# Patient Record
Sex: Male | Born: 1975 | Hispanic: Refuse to answer | Marital: Married | Smoking: Never smoker
Health system: Southern US, Community
[De-identification: ages and names within clinical notes are randomized; demographics above are authoritative.]

## PROBLEM LIST (undated history)

## (undated) DIAGNOSIS — J45909 Unspecified asthma, uncomplicated: Secondary | ICD-10-CM

## (undated) DIAGNOSIS — N419 Inflammatory disease of prostate, unspecified: Secondary | ICD-10-CM

## (undated) DIAGNOSIS — J189 Pneumonia, unspecified organism: Secondary | ICD-10-CM

## (undated) DIAGNOSIS — F32A Depression, unspecified: Secondary | ICD-10-CM

## (undated) DIAGNOSIS — T8859XA Other complications of anesthesia, initial encounter: Secondary | ICD-10-CM

## (undated) DIAGNOSIS — I10 Essential (primary) hypertension: Secondary | ICD-10-CM

## (undated) DIAGNOSIS — R112 Nausea with vomiting, unspecified: Secondary | ICD-10-CM

## (undated) DIAGNOSIS — K219 Gastro-esophageal reflux disease without esophagitis: Secondary | ICD-10-CM

## (undated) DIAGNOSIS — K469 Unspecified abdominal hernia without obstruction or gangrene: Secondary | ICD-10-CM

## (undated) DIAGNOSIS — K429 Umbilical hernia without obstruction or gangrene: Secondary | ICD-10-CM

## (undated) DIAGNOSIS — G4730 Sleep apnea, unspecified: Secondary | ICD-10-CM

## (undated) DIAGNOSIS — F329 Major depressive disorder, single episode, unspecified: Secondary | ICD-10-CM

## (undated) HISTORY — DX: Unspecified asthma, uncomplicated: J45.909

## (undated) HISTORY — DX: Umbilical hernia without obstruction or gangrene: K42.9

## (undated) HISTORY — PX: DISTAL BICEPS TENDON REPAIR: SHX1461

## (undated) HISTORY — DX: Essential (primary) hypertension: I10

## (undated) HISTORY — DX: Inflammatory disease of prostate, unspecified: N41.9

## (undated) HISTORY — DX: Major depressive disorder, single episode, unspecified: F32.9

## (undated) HISTORY — DX: Unspecified abdominal hernia without obstruction or gangrene: K46.9

## (undated) HISTORY — DX: Depression, unspecified: F32.A

---

## 2005-12-10 ENCOUNTER — Ambulatory Visit: Payer: Self-pay | Admitting: General Surgery

## 2005-12-10 HISTORY — PX: HERNIA REPAIR: SHX51

## 2007-03-20 DIAGNOSIS — K469 Unspecified abdominal hernia without obstruction or gangrene: Secondary | ICD-10-CM

## 2007-03-20 HISTORY — DX: Unspecified abdominal hernia without obstruction or gangrene: K46.9

## 2013-01-17 DIAGNOSIS — N419 Inflammatory disease of prostate, unspecified: Secondary | ICD-10-CM

## 2013-01-17 HISTORY — DX: Inflammatory disease of prostate, unspecified: N41.9

## 2013-02-18 ENCOUNTER — Encounter: Payer: Self-pay | Admitting: General Surgery

## 2013-02-18 ENCOUNTER — Ambulatory Visit (INDEPENDENT_AMBULATORY_CARE_PROVIDER_SITE_OTHER): Payer: BC Managed Care – PPO | Admitting: General Surgery

## 2013-02-18 VITALS — BP 138/86 | HR 86 | Resp 16 | Ht 72.0 in | Wt 312.0 lb

## 2013-02-18 DIAGNOSIS — K429 Umbilical hernia without obstruction or gangrene: Secondary | ICD-10-CM

## 2013-02-18 HISTORY — DX: Umbilical hernia without obstruction or gangrene: K42.9

## 2013-02-18 NOTE — Patient Instructions (Addendum)
Patient to be scheduled for umbilical hernia repair. Patient advised of lifting restrictions and driving after being pain free.   Hernia Repair with Laparoscope A hernia occurs when an internal organ pushes out through a weak spot in the belly (abdominal) wall muscles. Hernias most commonly occur in the groin and around the navel. Hernias can also occur through a cut by the surgeon (incision) after an abdominal operation. A hernia may be caused by:  Lifting heavy objects.  Prolonged coughing.  Straining to move your bowels. Hernias can often be pushed back into place (reduced). Most hernias tend to get worse over time. Problems occur when abdominal contents get stuck in the opening and the blood supply is blocked or impaired (incarcerated hernia). Because of these risks, you require surgery to repair the hernia. Your hernia will be repaired using a laparoscope. Laparoscopic surgery is a type of minimally invasive surgery. It does not involve making a typical surgical cut (incision) in the skin. A laparoscope is a telescope-like rod and lens system. It is usually connected to a video camera and a light source so your caregiver can clearly see the operative area. The instruments are inserted through  to  inch (5 mm or 10 mm) openings in the skin at specific locations. A working and viewing space is created by blowing a small amount of carbon dioxide gas into the abdominal cavity. The abdomen is essentially blown up like a balloon (insufflated). This elevates the abdominal wall above the internal organs like a dome. The carbon dioxide gas is common to the human body and can be absorbed by tissue and removed by the respiratory system. Once the repair is completed, the small incisions will be closed with either stitches (sutures) or staples (just like a paper stapler only this staple holds the skin together). LET YOUR CAREGIVERS KNOW ABOUT:  Allergies.  Medications taken including herbs, eye drops, over  the counter medications, and creams.  Use of steroids (by mouth or creams).  Previous problems with anesthetics or Novocaine.  Possibility of pregnancy, if this applies.  History of blood clots (thrombophlebitis).  History of bleeding or blood problems.  Previous surgery.  Other health problems. BEFORE THE PROCEDURE  Laparoscopy can be done either in a hospital or out-patient clinic. You may be given a mild sedative to help you relax before the procedure. Once in the operating room, you will be given a general anesthesia to make you sleep (unless you and your caregiver choose a different anesthetic).  AFTER THE PROCEDURE  After the procedure you will be watched in a recovery area. Depending on what type of hernia was repaired, you might be admitted to the hospital or you might go home the same day. With this procedure you may have less pain and scarring. This usually results in a quicker recovery and less risk of infection. HOME CARE INSTRUCTIONS   Bed rest is not required. You may continue your normal activities but avoid heavy lifting (more than 10 pounds) or straining.  Cough gently. If you are a smoker it is best to stop, as even the best hernia repair can break down with the continual strain of coughing.  Avoid driving until given the OK by your surgeon.  There are no dietary restrictions unless given otherwise.  TAKE ALL MEDICATIONS AS DIRECTED.  Only take over-the-counter or prescription medicines for pain, discomfort, or fever as directed by your caregiver. SEEK MEDICAL CARE IF:   There is increasing abdominal pain or pain in your  incisions.  There is more bleeding from incisions, other than minimal spotting.  You feel light headed or faint.  You develop an unexplained fever, chills, and/or an oral temperature above 102 F (38.9 C).  You have redness, swelling, or increasing pain in the wound.  Pus coming from wound.  A foul smell coming from the wound or  dressings. SEEK IMMEDIATE MEDICAL CARE IF:   You develop a rash.  You have difficulty breathing.  You have any allergic problems. MAKE SURE YOU:   Understand these instructions.  Will watch your condition.  Will get help right away if you are not doing well or get worse. Document Released: 03/05/2005 Document Revised: 05/28/2011 Document Reviewed: 02/02/2009 [facility] Patient Information 2014 [place], [place].  Patient's surgery has been scheduled for 03-06-13 at [facility].

## 2013-02-18 NOTE — Progress Notes (Addendum)
Patient ID: Shawn Bates, male   DOB: February 11, 1976, 37 y.o.   MRN: [id number]  Chief Complaint  Patient presents with  . Other    New patient evaluation of an umbilical hernia    HPI Shawn Bates is a 37 y.o. male who presents for an evaluation of an umbilical hernia. The patient was referred by Dr. Thana Ates. The patient states he noticed the hernia approximately 6 months ago. It has gotten larger in size. He also states that he is starting to have tenderness and discomfort when he leans or bumps up against that area. 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 He denies any nausea, vomiting, diarrhea, or constipation.   HPI  Past Medical History  Diagnosis Date  . Hernia 2009  . Asthma   . Depression   . Hypertension     Past Surgical History  Procedure Laterality Date  . Hernia repair  December 10, 2005    ventral hernia with Kugel patch.    Family History  Problem Relation Age of Onset  . Cancer Mother     Social History History  Substance Use Topics  . Smoking status: Never Smoker   . Smokeless tobacco: Not on file  . Alcohol Use: No    No Known Allergies  Current Outpatient Prescriptions  Medication Sig Dispense Refill  . ciprofloxacin (CIPRO) 500 MG tablet Take 1 tablet by mouth 2 (two) times daily.      . citalopram (CELEXA) 10 MG tablet Take 1-2 tablets by mouth daily.       No current facility-administered medications for this visit.    Review of Systems Review of Systems  Constitutional: Negative.   Respiratory: Negative.   Cardiovascular: Negative.   Gastrointestinal: Positive for abdominal pain.  Genitourinary: Positive for frequency (improved with oral Cipro therapy).    Blood pressure 138/86, pulse 86, resp. rate 16, height 6' (1.829 m), weight 312 lb (141.522 kg).  Physical Exam Physical Exam  Constitutional: He is oriented to person, place, and time. He appears well-developed and well-nourished.  Neck: No thyromegaly present.  Cardiovascular: Normal  rate, regular rhythm and normal heart sounds.   No murmur heard. Pulmonary/Chest: Effort normal and breath sounds normal.  Abdominal: Soft. Normal appearance and bowel sounds are normal. There is tenderness. A hernia is present.  1 cm fascial defect at the umbilicus. Epigastric incision site well healed.   Lymphadenopathy:    He has no cervical adenopathy.  Neurological: He is alert and oriented to person, place, and time.  Skin: Skin is warm and dry.    Data Reviewed PCP Notes of February 10, 2013.  Assessment    Umbilical hernia.  Recent prostatitis, improved on therapy.    Plan    Arrangements are in place to have the patient undergo umbilical hernia repair in the near future. The possible role of prosthetic mesh was again reviewed. 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 This can be adjusted at his workplace as it was in 2007.     Patient's surgery has been scheduled for 03-06-13 at [facility].   Earline Mayotte 02/20/2013, 8:36 PM

## 2013-02-20 ENCOUNTER — Encounter: Payer: Self-pay | Admitting: General Surgery

## 2013-02-21 ENCOUNTER — Encounter: Payer: Self-pay | Admitting: General Surgery

## 2013-02-21 ENCOUNTER — Other Ambulatory Visit: Payer: Self-pay | Admitting: General Surgery

## 2013-02-21 DIAGNOSIS — K429 Umbilical hernia without obstruction or gangrene: Secondary | ICD-10-CM

## 2013-03-02 ENCOUNTER — Ambulatory Visit: Payer: Self-pay | Admitting: Otolaryngology

## 2013-03-02 LAB — URINALYSIS, COMPLETE
Bacteria: NONE SEEN
Bilirubin,UR: NEGATIVE
Glucose,UR: NEGATIVE mg/dL (ref 0–75)
Ketone: NEGATIVE
Leukocyte Esterase: NEGATIVE
Nitrite: NEGATIVE
Ph: 6 (ref 4.5–8.0)
Protein: NEGATIVE

## 2013-03-02 LAB — URINALYSIS
Bilirubin, UA: NEGATIVE
Glucose, UA: NEGATIVE
Protein, UA: NEGATIVE
Specific Gravity, UA: 1.009 (ref 1.005–1.030)
pH, UA: 6 (ref 4.5–8.0)

## 2013-03-04 ENCOUNTER — Encounter: Payer: Self-pay | Admitting: General Surgery

## 2013-03-06 ENCOUNTER — Ambulatory Visit: Payer: Self-pay | Admitting: General Surgery

## 2013-03-06 DIAGNOSIS — K429 Umbilical hernia without obstruction or gangrene: Secondary | ICD-10-CM

## 2013-03-06 HISTORY — PX: HERNIA REPAIR: SHX51

## 2013-03-09 ENCOUNTER — Encounter: Payer: Self-pay | Admitting: General Surgery

## 2013-03-18 ENCOUNTER — Encounter: Payer: Self-pay | Admitting: General Surgery

## 2013-03-18 ENCOUNTER — Ambulatory Visit (INDEPENDENT_AMBULATORY_CARE_PROVIDER_SITE_OTHER): Payer: BC Managed Care – PPO | Admitting: General Surgery

## 2013-03-18 VITALS — BP 130/74 | HR 76 | Resp 14 | Ht 72.0 in | Wt 309.0 lb

## 2013-03-18 DIAGNOSIS — K429 Umbilical hernia without obstruction or gangrene: Secondary | ICD-10-CM

## 2013-03-18 NOTE — Patient Instructions (Addendum)
Patient to return as needed. Proper lifting techniques reviewed. 

## 2013-03-18 NOTE — Progress Notes (Signed)
Patient ID: Shawn Bates, male   DOB: [birth year], 37 y.o.   MRN: [id number]  Chief Complaint  Patient presents with  . Routine Post Op    umbilical hernia repair    HPI Shawn Bates is a 37 y.o. male here today for post op umbilical hernia repair done on 03/06/13. Patient states he is doing well. Minimal narcotic requirements. HPI  Past Medical History  Diagnosis Date  . Hernia 2009  . Asthma   . Depression   . Hypertension   . Prostatitis November 2014    Treated with oral Cipro with relief of symptoms    Past Surgical History  Procedure Laterality Date  . Hernia repair  December 10, 2005    ventral hernia with Kugel patch.  . Hernia repair  03/06/13    umbilical hernia    Family History  Problem Relation Age of Onset  . Cancer Mother     Social History History  Substance Use Topics  . Smoking status: Never Smoker   . Smokeless tobacco: Never Used  . Alcohol Use: No    No Known Allergies  Current Outpatient Prescriptions  Medication Sig Dispense Refill  . citalopram (CELEXA) 10 MG tablet Take 1-2 tablets by mouth daily.      Marland Kitchen HYDROcodone-acetaminophen (NORCO/VICODIN) 5-325 MG per tablet Take 1 tablet by mouth as needed.      . ciprofloxacin (CIPRO) 500 MG tablet Take 1 tablet by mouth 2 (two) times daily.       No current facility-administered medications for this visit.    Review of Systems Review of Systems  Constitutional: Negative.   Respiratory: Negative.   Cardiovascular: Negative.     Blood pressure 130/74, pulse 76, resp. rate 14, height 6' (1.829 m), weight 309 lb (140.161 kg).  Physical Exam Physical Exam  Constitutional: He is oriented to person, place, and time. He appears well-developed and well-nourished.  Eyes: No scleral icterus.  Lymphadenopathy:    He has no cervical adenopathy.  Neurological: He is alert and oriented to person, place, and time.  Skin: Skin is warm and dry.      Assessment    Doing well status post primary  repair of umbilical hernia.     Plan    Good judgment was strenuous activity was discussed. He'll call if he has any concerns or questions. Follow up otherwise will be on an as needed basis.        Earline Mayotte 03/20/2013, 1:47 PM

## 2013-03-24 ENCOUNTER — Ambulatory Visit: Payer: BC Managed Care – PPO | Admitting: General Surgery

## 2013-04-06 ENCOUNTER — Encounter: Payer: Self-pay | Admitting: General Surgery

## 2014-07-09 NOTE — Op Note (Signed)
PATIENT NAME:  Shawn Bates, Shawn Bates MR#:  [id number] DATE OF BIRTH:  1975/08/10  DATE OF PROCEDURE:  03/06/2013  PREOPERATIVE DIAGNOSIS:  Umbilical hernia.   POSTOPERATIVE DIAGNOSIS:  Umbilical hernia.  OPERATIVE PROCEDURE:  Repair of umbilical hernia.   SURGEON:  Donnalee CurryJeffrey Anjolina Byrer, M.D.   ANESTHESIA:  General by LMA, Marcaine 0.5% plain 30 mL local infiltration, Toradol 30 mg.   CLINICAL NOTE:  This 39 year old male has developed a symptomatic umbilical hernia.  He was admitted for elective repair.  He received Kefzol intravenously.   OPERATIVE NOTE:  With the patient under adequate general anesthesia, the abdomen was prepped with ChloraPrep and draped.  Hair had previously been removed with clippers.  An infraumbilical incision was made and carried down through the skin and subcutaneous tissue with hemostasis achieved by electrocautery.  The hernia sac was excised at the fascial level and discarded.  The preperitoneal fat was returned to its normal location and the undersurface of the fascia cleared.  Interrupted 0 Surgilon sutures were placed under direct vision.  The defect was approximately 8 to 10 mm in diameter.  The umbilical skin was tacked to the fascia with 3-0 Vicryl.  The adipose tissue was approximated with running 3-0 Vicryl.  The skin was closed with a running 4-0 Vicryl subcuticular suture.  Benzoin and Steri-Strips were applied followed by a Telfa and Tegaderm dressing.  Marcaine had been infiltrated into the wound prior to the procedure and Toradol was placed into the wound for postoperative analgesia.   The patient tolerated the procedure well.    ____________________________ Earline MayotteJeffrey W. Annayah Worthley, MD jwb:ea D: 03/06/2013 21:56:08 ET T: 03/07/2013 06:08:25 ET JOB#: [id number]  cc: Earline MayotteJeffrey W. Leylanie Woodmansee, MD, <Dictator> Earline MayotteJeffrey W. Charity Tessier, MD Dennison MascotLemont Morrisey, MD Jakiyah Stepney Brion AlimentW Tashayla Therien MD ELECTRONICALLY SIGNED 03/07/2013 10:36

## 2014-11-10 ENCOUNTER — Telehealth: Payer: Self-pay | Admitting: Urology

## 2014-11-10 ENCOUNTER — Other Ambulatory Visit: Payer: Self-pay | Admitting: Urology

## 2014-11-10 MED ORDER — DOXYCYCLINE HYCLATE 100 MG PO CAPS: 100.0000 mg | ORAL_CAPSULE | Freq: Every day | ORAL | Status: DC

## 2014-11-10 NOTE — Telephone Encounter (Signed)
[facility] 

## 2014-11-10 NOTE — Telephone Encounter (Signed)
Patient called today and stated that he is having a flare up with chronic prostatitits and has not been seen since 04-2014 by dr. Edwyna Shell. i have made  Him an appt to see him for 11-30-14. He would like a script for doxycyline  called [facility] to [facility] [address] to get him thru until his appt. i asked him to give [place] until the end of business today to get that called in for him.  Thanks, Marcelino Duster

## 2014-11-30 ENCOUNTER — Ambulatory Visit: Payer: Self-pay | Admitting: Urology

## 2014-12-22 ENCOUNTER — Encounter: Payer: Self-pay | Admitting: Urology

## 2014-12-22 ENCOUNTER — Other Ambulatory Visit: Payer: Self-pay | Admitting: Urology

## 2014-12-22 ENCOUNTER — Ambulatory Visit (INDEPENDENT_AMBULATORY_CARE_PROVIDER_SITE_OTHER): Payer: BLUE CROSS/BLUE SHIELD | Admitting: Urology

## 2014-12-22 VITALS — BP 134/91 | HR 101 | Ht 72.0 in | Wt 340.3 lb

## 2014-12-22 DIAGNOSIS — N3281 Overactive bladder: Secondary | ICD-10-CM | POA: Diagnosis not present

## 2014-12-22 DIAGNOSIS — N410 Acute prostatitis: Secondary | ICD-10-CM

## 2014-12-22 LAB — URINALYSIS, COMPLETE
Bilirubin, UA: NEGATIVE
Glucose, UA: NEGATIVE
KETONES UA: NEGATIVE
Leukocytes, UA: NEGATIVE
NITRITE UA: NEGATIVE
Protein, UA: NEGATIVE
Specific Gravity, UA: >1.03 — ABNORMAL HIGH (ref 1.005–1.030)
Urobilinogen, Ur: 0.2 mg/dL (ref 0.2–1.0)
pH, UA: 5.5 (ref 5.0–7.5)

## 2014-12-22 LAB — MICROSCOPIC EXAMINATION
RBC MICROSCOPIC, UA: NONE SEEN /HPF (ref 0–?)
Renal Epithel, UA: NONE SEEN /hpf

## 2014-12-22 MED ORDER — SOLIFENACIN SUCCINATE 10 MG PO TABS
10.0000 mg | ORAL_TABLET | Freq: Every day | ORAL | Status: DC
Start: 2014-12-22 — End: 2016-04-10

## 2014-12-22 NOTE — Telephone Encounter (Signed)
I spoke with Mr Blowe; he stated that he has picked up his Rx at his pharmacy. . . sm

## 2014-12-22 NOTE — Progress Notes (Signed)
12/22/2014 9:12 AM   Shawn Bates [birth year] [id number]  Referring provider: Dennison Mascot, MD [address] [place], [place] [address]  Chief Complaint  Patient presents with  . Prostatitis    HPI: Patient has 2 complaints. First is of frequency of urination every 15 minutes although it has improved somewhat with treatment for his chronic prostatitis in the form of long-term low-dose doxycycline. He does not drink caffeine laden beverages or smoking. He relates no urinary tract infections is not diabetic at this point although he is somewhat overweight.   PMH: Past Medical History  Diagnosis Date  . Hernia 2009  . Asthma   . Depression   . Hypertension   . Prostatitis November 2014    Treated with oral Cipro with relief of symptoms  . Umbilical hernia 02/18/2013    Surgical History: Past Surgical History  Procedure Laterality Date  . Hernia repair  December 10, 2005    ventral hernia with Kugel patch.  . Hernia repair  03/06/13    umbilical hernia    Home Medications:    Medication List       This list is accurate as of: 12/22/14  9:12 AM.  Always use your most recent med list.               doxycycline 100 MG capsule  Commonly known as:  VIBRAMYCIN  Take 1 capsule (100 mg total) by mouth daily.     naproxen sodium 220 MG tablet  Commonly known as:  ANAPROX  Take 220 mg by mouth 3 (three) times daily with meals.        Allergies: No Known Allergies  Family History: Family History  Problem Relation Age of Onset  . Bone cancer Mother   . Prostate cancer Neg Hx   . Bladder Cancer Neg Hx   . Kidney disease Neg Hx     Social History:  reports that he has never smoked. He has never used smokeless tobacco. He reports that he does not drink alcohol or use illicit drugs.  ROS: UROLOGY Frequent Urination?: Yes Hard to postpone urination?: No Burning/pain with urination?: No Get up at night to urinate?: Yes Leakage of urine?:  No Urine stream starts and stops?: No Trouble starting stream?: No Do you have to strain to urinate?: No Blood in urine?: No Urinary tract infection?: No Sexually transmitted disease?: No Injury to kidneys or bladder?: No Painful intercourse?: No Weak stream?: No Erection problems?: No Penile pain?: No  Gastrointestinal Nausea?: No Vomiting?: No Indigestion/heartburn?: No Diarrhea?: No Constipation?: No  Constitutional Fever: No Night sweats?: No Weight loss?: No Fatigue?: No  Skin Skin rash/lesions?: No Itching?: No  Eyes Blurred vision?: No Double vision?: No  Ears/Nose/Throat Sore throat?: No Sinus problems?: No  Hematologic/Lymphatic Swollen glands?: No Easy bruising?: No  Cardiovascular Leg swelling?: No Chest pain?: No  Respiratory Cough?: No Shortness of breath?: No  Endocrine Excessive thirst?: No  Musculoskeletal Back pain?: No Joint pain?: No  Neurological Headaches?: No Dizziness?: No  Psychologic Depression?: No Anxiety?: No  Physical Exam: BP 134/91 mmHg  Pulse 101  Ht 6' (1.829 m)  Wt 340 lb 4.8 oz (154.359 kg)  BMI 46.14 kg/m2  Constitutional:  Alert and oriented, No acute distress. HEENT: [place] AT, moist mucus membranes.  Trachea midline, no masses. Cardiovascular: No clubbing, cyanosis, or edema. Respiratory: Normal respiratory effort, no increased work of breathing. GI: Abdomen is soft, nontender, nondistended, no abdominal masses GU: No CVA tenderness. Penis  and testes normal Skin: No rashes, bruises or suspicious lesions. Lymph: No cervical or inguinal adenopathy. Neurologic: Grossly intact, no focal deficits, moving all 4 extremities. Psychiatric: Normal mood and affect.  Laboratory Data: No results found for: WBC, HGB, HCT, MCV, PLT  No results found for: CREATININE  No results found for: PSA  No results found for: TESTOSTERONE  No results found for: HGBA1C  Urinalysis    Component Value Date/Time    COLORURINE Straw 03/02/2013 1530   COLORURINE straw 03/02/2013   APPEARANCEUR Clear 03/02/2013 1530   LABSPEC 1.009 03/02/2013 1530   PHURINE 6.0 03/02/2013 1530   GLUCOSEU Negative 03/02/2013 1530   GLUCOSEU Negative 03/02/2013   HGBUR 1+ 03/02/2013 1530   BILIRUBINUR Negative 03/02/2013 1530   BILIRUBINUR Negative 03/02/2013   KETONESUR Negative 03/02/2013 1530   PROTEINUR Negative 03/02/2013 1530   NITRITE Negative 03/02/2013 1530   NITRITE Negative 03/02/2013   LEUKOCYTESUR Negative 03/02/2013 1530   LEUKOCYTESUR Negative 03/02/2013    Pertinent Imaging: None  Assessment & Plan:  Patient with prostatitis and probable overactive bladder. Plan is to continue him on his doxycycline as needed but to add Vesicare 10 mg daily for 3 months. He had been on an anticholinergic in the past and it helped him with his every 15 minutes urge to void. Patient controls his caffeine intake very well. He does not smoke. He relates some occasional problems with holding his erection but generally has good erectile ability but not like he was when he was 28 years I counseled him on the fact that as he gets older there may be times when he doesn't hold his erection to completion of the sexual activity I will follow-up with him in his office in 3  There are no diagnoses linked to this encounter.  No Follow-up on file.  Lorraine Lax, MD  [facility] [address], [address] [place], [place] [address] [phone]

## 2014-12-22 NOTE — Telephone Encounter (Signed)
Mr. Venus was seen in the office this morning by Dr. Edwyna Shell.  He was supposed to call something in to his pharmacy for overactive bladder.  The pharmacy he uses is the [facility].  Please call the Rx to there.

## 2015-03-28 ENCOUNTER — Ambulatory Visit: Payer: BLUE CROSS/BLUE SHIELD

## 2015-04-25 ENCOUNTER — Ambulatory Visit: Payer: BLUE CROSS/BLUE SHIELD

## 2016-01-16 ENCOUNTER — Encounter: Payer: Self-pay | Admitting: Urology

## 2016-01-16 ENCOUNTER — Ambulatory Visit: Payer: BLUE CROSS/BLUE SHIELD | Admitting: Urology

## 2016-01-16 VITALS — BP 158/107 | HR 102 | Ht 72.0 in | Wt 330.3 lb

## 2016-01-16 DIAGNOSIS — N3281 Overactive bladder: Secondary | ICD-10-CM

## 2016-01-16 DIAGNOSIS — R351 Nocturia: Secondary | ICD-10-CM | POA: Diagnosis not present

## 2016-01-16 LAB — URINALYSIS, COMPLETE
Bilirubin, UA: NEGATIVE
Glucose, UA: NEGATIVE
KETONES UA: NEGATIVE
LEUKOCYTES UA: NEGATIVE
NITRITE UA: NEGATIVE
Protein, UA: NEGATIVE
SPEC GRAV UA: 1.015 (ref 1.005–1.030)
Urobilinogen, Ur: 0.2 mg/dL (ref 0.2–1.0)
pH, UA: 6 (ref 5.0–7.5)

## 2016-01-16 LAB — MICROSCOPIC EXAMINATION
BACTERIA UA: NONE SEEN
EPITHELIAL CELLS (NON RENAL): NONE SEEN /HPF (ref 0–10)

## 2016-01-16 LAB — BLADDER SCAN AMB NON-IMAGING: Scan Result: 80

## 2016-01-16 NOTE — Progress Notes (Signed)
40 year old male who presents today for symptoms of overactive bladder. The patient states that his symptoms began approximately 2 weeks ago. He states that he has been getting up one to 2 times per night. He also states that he has a feeling of incomplete bladder emptying and urinary frequency. The patient has a history of prostatitis, he was treated for this in August 2016. Follow-up in October 20 16 Disher the patient symptoms it clears and he was back to his baseline. The patient does not take any medications for his voiding symptoms.  The patient states that he substrate can 3 hours prior to bedtime. He does have obstructive sleep apnea for which he is scheduled for a sleep study in the past and declined it. He does drink a lot of soda, coffee. He denies constipation.   Current Outpatient Prescriptions on File Prior to Visit  Medication Sig Dispense Refill  . doxycycline (VIBRAMYCIN) 100 MG capsule Take 1 capsule (100 mg total) by mouth daily. (Patient not taking: Reported on 01/16/2016) 30 capsule 0  . naproxen sodium (ANAPROX) 220 MG tablet Take 220 mg by mouth 3 (three) times daily with meals.    . solifenacin (VESICARE) 10 MG tablet Take 1 tablet (10 mg total) by mouth daily. (Patient not taking: Reported on 01/16/2016) 90 tablet 2   No current facility-administered medications on file prior to visit.    Past Medical History:  Diagnosis Date  . Asthma   . Depression   . Hernia 2009  . Hypertension   . Prostatitis November 2014   Treated with oral Cipro with relief of symptoms  . Umbilical hernia 02/18/2013   Past Surgical History:  Procedure Laterality Date  . HERNIA REPAIR  December 10, 2005   ventral hernia with Kugel patch.  Marland Kitchen. HERNIA REPAIR  03/06/13   umbilical hernia   NAD Vitals:   01/16/16 1023  BP: (!) 158/107  Pulse: (!) 102   Obese ABdomen soft No peripheral edema  Urine dipstick shows negative for all components.  PVR: 80 mL's  Impression: The patient  has symptoms of overactive bladder. The etiology of his acute progressive symptoms is unclear to me. However, his UA is normal and his symptoms are otherwise reassuring.  Recommendations: I went over numerous suggestions for the patient to help minimize his voiding symptoms including increasing his fluid intake, namely water. I encouraged him to decrease the amount of coffee and soda and his diet. I also went over the dietary guidelines for interstitial cystitis essentially eliminating spicy foods. Further, I explained to him the name role of obstructive sleep apnea in nocturia and encouraged the patient to consider a sleep study. Finally, I reassured the patient in regards to his symptoms. I suggested that the patient try myrbetriq 50 mg daily and I have given him a month's supply. I suspect he will need this for few days/weeks but if he needs a refill or a new prescription he is to call the office. Otherwise, the patient will follow-up with us as needed.

## 2016-01-26 ENCOUNTER — Ambulatory Visit: Payer: BLUE CROSS/BLUE SHIELD

## 2016-04-10 ENCOUNTER — Ambulatory Visit (INDEPENDENT_AMBULATORY_CARE_PROVIDER_SITE_OTHER): Payer: BLUE CROSS/BLUE SHIELD | Admitting: Family Medicine

## 2016-04-10 ENCOUNTER — Encounter: Payer: Self-pay | Admitting: Family Medicine

## 2016-04-10 DIAGNOSIS — M62830 Muscle spasm of back: Secondary | ICD-10-CM | POA: Diagnosis not present

## 2016-04-10 DIAGNOSIS — Z8709 Personal history of other diseases of the respiratory system: Secondary | ICD-10-CM | POA: Diagnosis not present

## 2016-04-10 MED ORDER — CYCLOBENZAPRINE HCL 5 MG PO TABS: 5.0000 mg | ORAL_TABLET | Freq: Three times a day (TID) | ORAL | 0 refills | Status: DC | PRN

## 2016-04-10 NOTE — Progress Notes (Signed)
Name: Shawn Bates   MRN: [id number]    DOB: 1975/05/31   Date:04/10/2016       Progress Note  Subjective  Chief Complaint  Chief Complaint  Patient presents with  . Follow-up    possible strep throat. On call MD called in script 2 weeks ago for Amox 875 mg  . Back Pain    LBP    Back Pain  This is a new problem. The current episode started in the past 7 days. The problem occurs daily. The pain is present in the lumbar spine. The quality of the pain is described as aching (described as a tightness in the lower back). The pain is at a severity of 6/10. The symptoms are aggravated by standing (prolonged standing makes it worse). Pertinent negatives include no bladder incontinence, bowel incontinence, leg pain, numbness, perianal numbness or tingling. He has tried analgesics and NSAIDs for the symptoms. The treatment provided no relief.    Past Medical History:  Diagnosis Date  . Asthma   . Depression   . Hernia 2009  . Hypertension   . Prostatitis November 2014   Treated with oral Cipro with relief of symptoms  . Umbilical hernia 02/18/2013    Past Surgical History:  Procedure Laterality Date  . HERNIA REPAIR  December 10, 2005   ventral hernia with Kugel patch.  Marland Kitchen HERNIA REPAIR  03/06/13   umbilical hernia    Family History  Problem Relation Age of Onset  . Bone cancer Mother   . Prostate cancer Neg Hx   . Bladder Cancer Neg Hx   . Kidney disease Neg Hx     Social History   Social History  . Marital status: Married    Spouse name: N/A  . Number of children: N/A  . Years of education: N/A   Occupational History  . Not on file.   Social History Main Topics  . Smoking status: Never Smoker  . Smokeless tobacco: Never Used  . Alcohol use No  . Drug use: No  . Sexual activity: Not on file   Other Topics Concern  . Not on file   Social History Narrative  . No narrative on file    No current outpatient prescriptions on file.  No Known  Allergies   Review of Systems  Gastrointestinal: Negative for bowel incontinence.  Genitourinary: Negative for bladder incontinence.  Musculoskeletal: Positive for back pain.  Neurological: Negative for tingling and numbness.    Objective  Vitals:   04/10/16 1446  BP: 130/80  Pulse: (!) 110  Resp: 18  Temp: 98.3 F (36.8 C)  TempSrc: Oral  SpO2: 96%  Weight: (!) 333 lb 14.4 oz (151.5 kg)  Height: 6' (1.829 m)    Physical Exam  Constitutional: He is oriented to person, place, and time and well-developed, well-nourished, and in no distress.  HENT:  Head: Normocephalic and atraumatic.  Mouth/Throat: Oropharyngeal exudate and posterior oropharyngeal erythema present.  Cardiovascular: Normal rate, regular rhythm and normal heart sounds.   No murmur heard. Pulmonary/Chest: Effort normal and breath sounds normal. He has no wheezes.  Musculoskeletal:       Lumbar back: He exhibits spasm. He exhibits no swelling and no pain.       Back:  Neurological: He is alert and oriented to person, place, and time.  Nursing note and vitals reviewed.        Assessment & Plan  1. History of streptococcal pharyngitis Patient has finished a course of amoxicillin, symptoms  have resolved, no indication for further testing  2. Back muscle spasm I can associated with prolonged standing versus abrupt movement involving the mid to lower back. Recommended heating pad and will start on cyclobenzaprine as needed. - cyclobenzaprine (FLEXERIL) 5 MG tablet; Take 1 tablet (5 mg total) by mouth 3 (three) times daily as needed for muscle spasms.  Dispense: 30 tablet; Refill: 0   [name] Asad A. Faylene KurtzShah [facility] 04/10/2016 2:52 PM

## 2017-06-13 ENCOUNTER — Encounter: Payer: Self-pay | Admitting: Emergency Medicine

## 2017-06-13 ENCOUNTER — Emergency Department
Admission: EM | Admit: 2017-06-13 | Discharge: 2017-06-13 | Disposition: A | Discharge status: Home / Self Care | Payer: BLUE CROSS/BLUE SHIELD | Attending: Emergency Medicine | Admitting: Emergency Medicine

## 2017-06-13 ENCOUNTER — Other Ambulatory Visit: Payer: Self-pay

## 2017-06-13 ENCOUNTER — Emergency Department: Payer: BLUE CROSS/BLUE SHIELD

## 2017-06-13 DIAGNOSIS — M25551 Pain in right hip: Secondary | ICD-10-CM | POA: Insufficient documentation

## 2017-06-13 DIAGNOSIS — I10 Essential (primary) hypertension: Secondary | ICD-10-CM | POA: Insufficient documentation

## 2017-06-13 DIAGNOSIS — R55 Syncope and collapse: Secondary | ICD-10-CM | POA: Insufficient documentation

## 2017-06-13 DIAGNOSIS — R112 Nausea with vomiting, unspecified: Secondary | ICD-10-CM | POA: Diagnosis not present

## 2017-06-13 DIAGNOSIS — J45909 Unspecified asthma, uncomplicated: Secondary | ICD-10-CM | POA: Diagnosis not present

## 2017-06-13 DIAGNOSIS — M25851 Other specified joint disorders, right hip: Secondary | ICD-10-CM | POA: Diagnosis not present

## 2017-06-13 DIAGNOSIS — R42 Dizziness and giddiness: Secondary | ICD-10-CM | POA: Diagnosis not present

## 2017-06-13 LAB — BASIC METABOLIC PANEL
Anion gap: 13 (ref 5–15)
BUN: 12 mg/dL (ref 6–20)
CHLORIDE: 105 mmol/L (ref 101–111)
CO2: 19 mmol/L — AB (ref 22–32)
CREATININE: 0.91 mg/dL (ref 0.61–1.24)
Calcium: 9.4 mg/dL (ref 8.9–10.3)
GFR calc non Af Amer: >60 mL/min (ref >60)
GLUCOSE: 157 mg/dL — AB (ref 65–99)
Potassium: 3.4 mmol/L — ABNORMAL LOW (ref 3.5–5.1)
Sodium: 137 mmol/L (ref 135–145)

## 2017-06-13 LAB — CBC
HEMATOCRIT: 41.9 % (ref 40.0–52.0)
HEMOGLOBIN: 13.8 g/dL (ref 13.0–18.0)
MCH: 29.5 pg (ref 26.0–34.0)
MCHC: 33 g/dL (ref 32.0–36.0)
MCV: 89.6 fL (ref 80.0–100.0)
Platelets: 337 10*3/uL (ref 150–440)
RBC: 4.68 MIL/uL (ref 4.40–5.90)
RDW: 13.3 % (ref 11.5–14.5)
WBC: 12.3 10*3/uL — ABNORMAL HIGH (ref 3.8–10.6)

## 2017-06-13 LAB — TROPONIN I: Troponin I: <0.03 ng/mL (ref <0.03)

## 2017-06-13 MED ORDER — PROMETHAZINE HCL 25 MG/ML IJ SOLN
12.5000 mg | Freq: Once | INTRAMUSCULAR | Status: AC
  Administered 2017-06-13: 12.5 mg via INTRAVENOUS
  Filled 2017-06-13: qty 1

## 2017-06-13 MED ORDER — SODIUM CHLORIDE 0.9 % IV BOLUS
1000.0000 mL | Freq: Once | INTRAVENOUS | Status: AC
Start: 2017-06-13 — End: 2017-06-13
  Administered 2017-06-13: 1000 mL via INTRAVENOUS

## 2017-06-13 MED ORDER — OXYCODONE-ACETAMINOPHEN 5-325 MG PO TABS: 1.0000 | ORAL_TABLET | Freq: Four times a day (QID) | ORAL | 0 refills | Status: DC | PRN

## 2017-06-13 MED ORDER — ONDANSETRON HCL 4 MG/2ML IJ SOLN
4.0000 mg | Freq: Once | INTRAMUSCULAR | Status: AC
  Administered 2017-06-13: 4 mg via INTRAVENOUS

## 2017-06-13 MED ORDER — HYDROMORPHONE HCL 1 MG/ML IJ SOLN
INTRAMUSCULAR | Status: AC
  Filled 2017-06-13: qty 1

## 2017-06-13 MED ORDER — SODIUM CHLORIDE 0.9 % IV BOLUS
1000.0000 mL | Freq: Once | INTRAVENOUS | Status: AC
  Administered 2017-06-13: 1000 mL via INTRAVENOUS

## 2017-06-13 MED ORDER — HYDROMORPHONE HCL 1 MG/ML IJ SOLN
1.0000 mg | Freq: Once | INTRAMUSCULAR | Status: AC
  Administered 2017-06-13: 1 mg via INTRAVENOUS

## 2017-06-13 MED ORDER — ONDANSETRON HCL 4 MG/2ML IJ SOLN
INTRAMUSCULAR | Status: AC
  Filled 2017-06-13: qty 2

## 2017-06-13 NOTE — ED Triage Notes (Signed)
Pt taken out of car.  Pt was at ortho office for right hip pain.  After xray was leaving office and collapsed with near syncope into car and wifes arms. Pt diaphoretic through clothes. Vomiting. C/o dizziness.  Weak getting out of car.  Dizziness has improved from prior per pt.

## 2017-06-13 NOTE — ED Notes (Signed)
Patient transported to CT 

## 2017-06-13 NOTE — ED Notes (Signed)
Pt given crutches

## 2017-06-13 NOTE — Discharge Instructions (Addendum)
Please follow-up with orthopedics as scheduled for further evaluation into your right hip pain.  Please take your pain medication as needed but only as prescribed.  You may also use ibuprofen for discomfort.  Limit total Tylenol/acetaminophen intake to 4 g/day (Percocet contains 325 mg of Tylenol per tablet).  Return to the emergency department for any further lightheadedness, dizziness, if you develop any chest pain or trouble breathing, or any other symptom personally concerning to yourself.

## 2017-06-13 NOTE — ED Provider Notes (Addendum)
[facility] Emergency Department Provider Note  Time seen: 10:10 AM  I have reviewed the triage vital signs and the nursing notes.   HISTORY  Chief Complaint Hip Pain and Near Syncope    HPI Shawn Bates is a 42 y.o. male with a past medical history of asthma, depression, hypertension, presents to the emergency department after near syncopal event.  According to the patient since Sunday he has been experiencing progressively worsening right hip pain.  Denies any inciting event.  States approxi-8 years ago he had similar discomfort in the right hip, but never found out what caused it.  Denies any trauma or falls.  Patient was seeing orthopedics today had x-rays performed, which he states significantly exacerbated his pain.  While walking out to his car he was experiencing severe right hip pain became dizzy/lightheaded and diaphoretic.  Denies any chest pain or trouble breathing abdominal pain.  Did become nauseated and vomit when he was feeling lightheaded.  Largely negative review of systems otherwise.  Describes as right hip pain currently is moderate to severe aching pain, sharp pain with attempted movement or ambulation.   Past Medical History:  Diagnosis Date  . Asthma   . Depression   . Hernia 2009  . Hypertension   . Prostatitis November 2014   Treated with oral Cipro with relief of symptoms  . Umbilical hernia 02/18/2013    Patient Active Problem List   Diagnosis Date Noted  . History of streptococcal pharyngitis 04/10/2016  . Back muscle spasm 04/10/2016  . Umbilical hernia 02/18/2013    Past Surgical History:  Procedure Laterality Date  . HERNIA REPAIR  December 10, 2005   ventral hernia with Kugel patch.  Marland Kitchen. HERNIA REPAIR  03/06/13   umbilical hernia    Prior to Admission medications   Medication Sig Start Date End Date Taking? Authorizing Provider  cyclobenzaprine (FLEXERIL) 5 MG tablet Take 1 tablet (5 mg total) by mouth 3 (three) times  daily as needed for muscle spasms. 04/10/16   Ellyn HackShah, Syed Asad A, MD    No Known Allergies  Family History  Problem Relation Age of Onset  . Bone cancer Mother   . Prostate cancer Neg Hx   . Bladder Cancer Neg Hx   . Kidney disease Neg Hx     Social History Social History   Tobacco Use  . Smoking status: Never Smoker  . Smokeless tobacco: Never Used  Substance Use Topics  . Alcohol use: No  . Drug use: No    Review of Systems Constitutional: Negative for fever.  Positive for lightheadedness, now resolved Eyes: Negative for visual complaints ENT: Negative for recent illness/congestion Cardiovascular: Negative for chest pain. Respiratory: Negative for shortness of breath. Gastrointestinal: Negative for abdominal pain.  Positive for nausea vomiting, now resolved Genitourinary: Negative for urinary compaints Musculoskeletal: Significant right hip pain.  No lower extremity swelling.   Skin: Negative for skin complaints  Neurological: Negative for headache All other ROS negative  ____________________________________________   PHYSICAL EXAM:  VITAL SIGNS: ED Triage Vitals  Enc Vitals Group     BP 06/13/17 1000 133/89     Pulse --      Resp 06/13/17 1000 (!) 22     Temp --      Temp src --      SpO2 06/13/17 1000 99 %     Weight 06/13/17 0952 (!) 310 lb (140.6 kg)     Height 06/13/17 0952 6' (1.829 m)  Head Circumference --      Peak Flow --      Pain Score 06/13/17 0952 10     Pain Loc --      Pain Edu? --      Excl. in GC? --     Constitutional: Alert and oriented. Well appearing and in no distress. Eyes: Normal exam ENT   Head: Normocephalic and atraumatic   Mouth/Throat: Mucous membranes are moist. Cardiovascular: Normal rate, regular rhythm. No murmur Respiratory: Normal respiratory effort without tachypnea nor retractions. Breath sounds are clear Gastrointestinal: Soft and nontender. No distention.  Musculoskeletal: Moderate right hip  tenderness to palpation, moderate pain elicited with minimal range of motion.  1+ DP pulse with warm extremity distally, sensation intact.  Able to move all toes well. Neurologic:  Normal speech and language. No gross focal neurologic deficits  Skin:  Skin is warm, dry and intact.  Psychiatric: Mood and affect are normal.   ____________________________________________    EKG  EKG reviewed and interpreted by myself shows what appears to be a sinus tachycardia at 100 bpm with a narrow QRS, normal axis, normal intervals, nonspecific ST changes.  ____________________________________________    RADIOLOGY  CT negative for acute abnormality.  ____________________________________________   INITIAL IMPRESSION / ASSESSMENT AND PLAN / ED COURSE  Pertinent labs & imaging results that were available during my care of the patient were reviewed by me and considered in my medical decision making (see chart for details).  Patient presents to the emergency department with right hip pain, lightheadedness, near syncopal event.  Differential would include near syncope, vasovagal syncope, musculoskeletal pain, fracture or dislocation.  Patient states he had x-rays performed earlier today which did not show any acute abnormality and they have an MRI ordered but he is not been told when it will be.  Given this we will obtain CT imaging of the right hip to rule out occult fracture given his significant pain.  Patient near syncopal episode likely vasovagal event as the patient states he was in severe pain attempting to ambulate back to his car when he began feeling lightheaded nauseated and became sweaty.  We will check labs.  EKG is reassuring.  We will treat pain and nausea as well as IV hydrate while awaiting imaging results.  Patient agreeable to this plan of care.   CT negative for acute abnormality.  Labs show slight white count elevation 12.3 otherwise negative including normal troponin.  EKG is  reassuring.  Patient feeling better after pain control.  Suspect likely vasovagal event due to right hip pain.  He is following up with orthopedics as an MRI ordered.  We will discharged with a course of pain medication.  I have instructed the patient drink plenty of fluids.  Patient agreeable to this plan of care.  We will provide crutches to be used if needed for assistance ambulating.  Patient desatted to 88% while sleeping, likely the result of pain medication.  Continues to deny any chest pain or abdominal pain.  No pleuritic pain.  No shortness of breath.  We will placed on 2 L of oxygen, states he is feeling somewhat nauseated we will dose Phenergan and 1 additional liter of IV fluids.  Patient states his pain is much improved in the right hip.  She states he feels much better.  Is asking to be discharged home.  We will discharged with crutches, instructed to drink plenty fluids. ____________________________________________   FINAL CLINICAL IMPRESSION(S) / ED DIAGNOSES  Right  hip pain Near syncope    Minna Antis, MD 06/13/17 1117    Minna Antis, MD 06/13/17 [phone]

## 2017-06-17 ENCOUNTER — Other Ambulatory Visit: Payer: Self-pay | Admitting: Orthopedic Surgery

## 2017-06-17 DIAGNOSIS — Z1811 Retained magnetic metal fragments: Secondary | ICD-10-CM

## 2017-06-17 DIAGNOSIS — M25851 Other specified joint disorders, right hip: Secondary | ICD-10-CM

## 2017-06-27 ENCOUNTER — Ambulatory Visit
Admission: RE | Admit: 2017-06-27 | Discharge: 2017-06-27 | Disposition: A | Discharge status: Home / Self Care | Payer: BLUE CROSS/BLUE SHIELD | Source: Ambulatory Visit | Attending: Orthopedic Surgery | Admitting: Orthopedic Surgery

## 2017-06-27 ENCOUNTER — Ambulatory Visit: Payer: BLUE CROSS/BLUE SHIELD

## 2017-06-27 ENCOUNTER — Other Ambulatory Visit: Payer: Self-pay | Admitting: Orthopedic Surgery

## 2017-06-27 DIAGNOSIS — M25851 Other specified joint disorders, right hip: Secondary | ICD-10-CM | POA: Diagnosis not present

## 2017-06-27 DIAGNOSIS — S76011A Strain of muscle, fascia and tendon of right hip, initial encounter: Secondary | ICD-10-CM | POA: Diagnosis not present

## 2017-06-27 DIAGNOSIS — Z1811 Retained magnetic metal fragments: Secondary | ICD-10-CM

## 2017-06-27 DIAGNOSIS — M5136 Other intervertebral disc degeneration, lumbar region: Secondary | ICD-10-CM | POA: Insufficient documentation

## 2017-06-27 DIAGNOSIS — M1611 Unilateral primary osteoarthritis, right hip: Secondary | ICD-10-CM | POA: Insufficient documentation

## 2017-06-27 DIAGNOSIS — Z0389 Encounter for observation for other suspected diseases and conditions ruled out: Secondary | ICD-10-CM | POA: Diagnosis not present

## 2017-06-27 DIAGNOSIS — Z135 Encounter for screening for eye and ear disorders: Secondary | ICD-10-CM | POA: Diagnosis not present

## 2017-06-27 DIAGNOSIS — M25551 Pain in right hip: Secondary | ICD-10-CM | POA: Diagnosis not present

## 2017-06-27 MED ORDER — GADOBENATE DIMEGLUMINE 529 MG/ML IV SOLN
0.1000 mL | Freq: Once | INTRAVENOUS | Status: AC | PRN
  Administered 2017-06-27: 0.1 mL via INTRA_ARTICULAR

## 2017-06-27 MED ORDER — SODIUM CHLORIDE 0.9 % IJ SOLN: 10.0000 mL | INTRAMUSCULAR | Status: DC | PRN

## 2017-06-27 MED ORDER — LIDOCAINE HCL (PF) 1 % IJ SOLN
5.0000 mL | Freq: Once | INTRAMUSCULAR | Status: AC
  Administered 2017-06-27: 10 mL
  Filled 2017-06-27: qty 5

## 2017-06-27 MED ORDER — IOPAMIDOL (ISOVUE-200) INJECTION 41%
50.0000 mL | Freq: Once | INTRAVENOUS | Status: AC | PRN
  Administered 2017-06-27: 15 mL
  Filled 2017-06-27: qty 50

## 2017-07-01 DIAGNOSIS — M545 Low back pain: Secondary | ICD-10-CM | POA: Diagnosis not present

## 2017-07-01 DIAGNOSIS — M1611 Unilateral primary osteoarthritis, right hip: Secondary | ICD-10-CM | POA: Diagnosis not present

## 2017-07-17 DIAGNOSIS — M25551 Pain in right hip: Secondary | ICD-10-CM | POA: Diagnosis not present

## 2018-01-12 DIAGNOSIS — Z23 Encounter for immunization: Secondary | ICD-10-CM | POA: Diagnosis not present

## 2018-07-05 DIAGNOSIS — R350 Frequency of micturition: Secondary | ICD-10-CM | POA: Diagnosis not present

## 2019-01-31 IMAGING — CR DG ORBITS FOR FOREIGN BODY
2 series · 2 of 2 positions shown · non-contrast
Comparison: None.

CLINICAL DATA: Metal working/exposure; clearance prior to MRI

EXAM:
ORBITS FOR FOREIGN BODY - 2 VIEW

[orbits waters (1 of 2)]
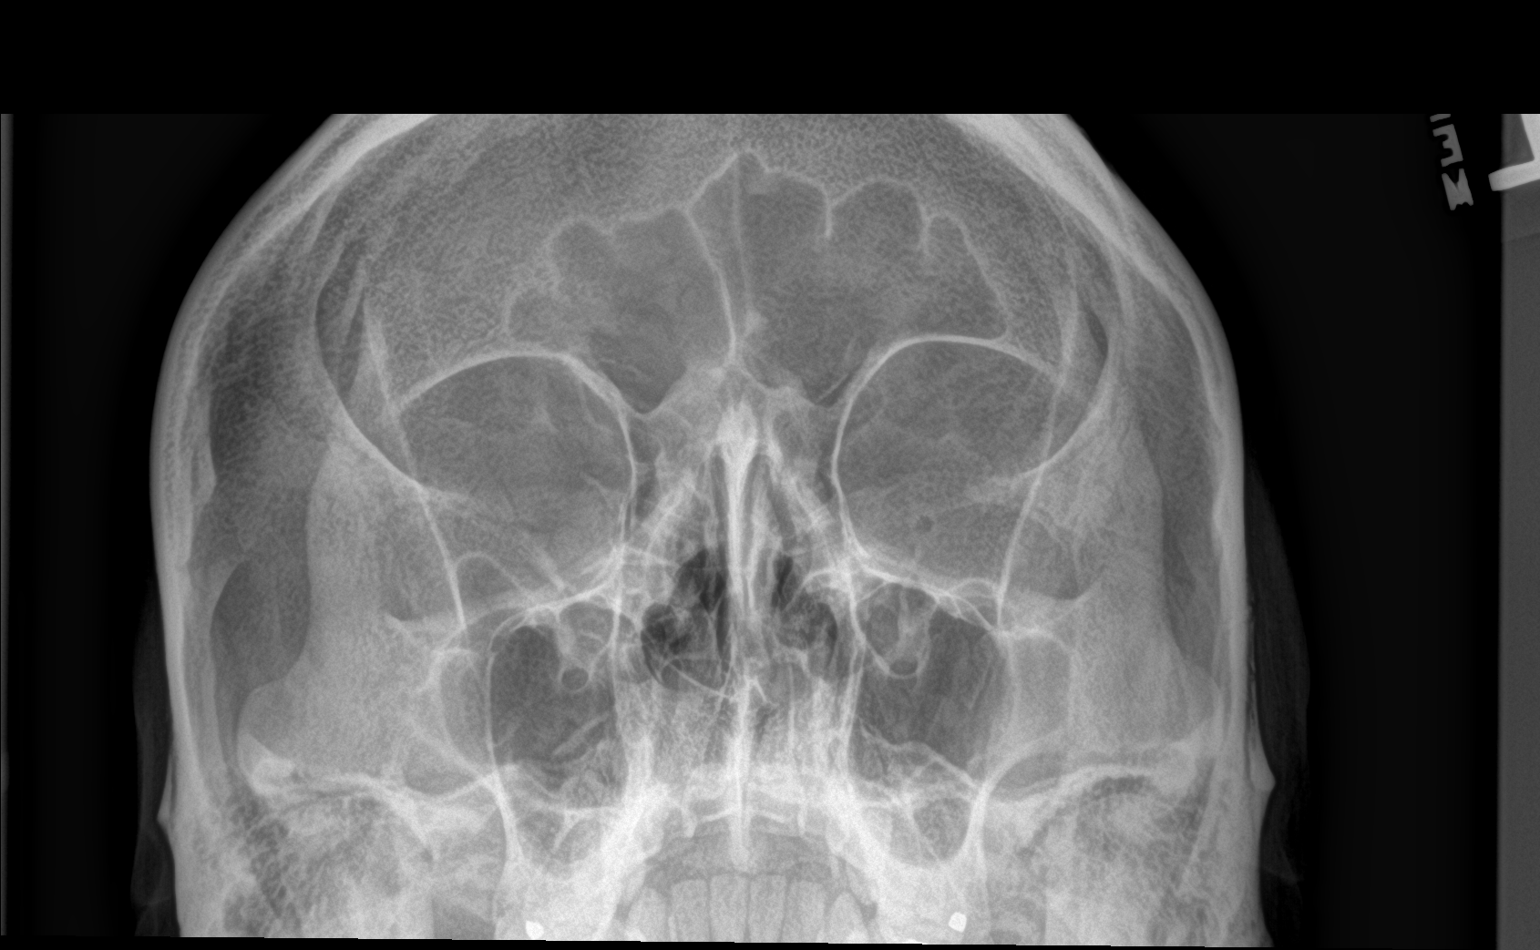

[orbits waters (2 of 2)]
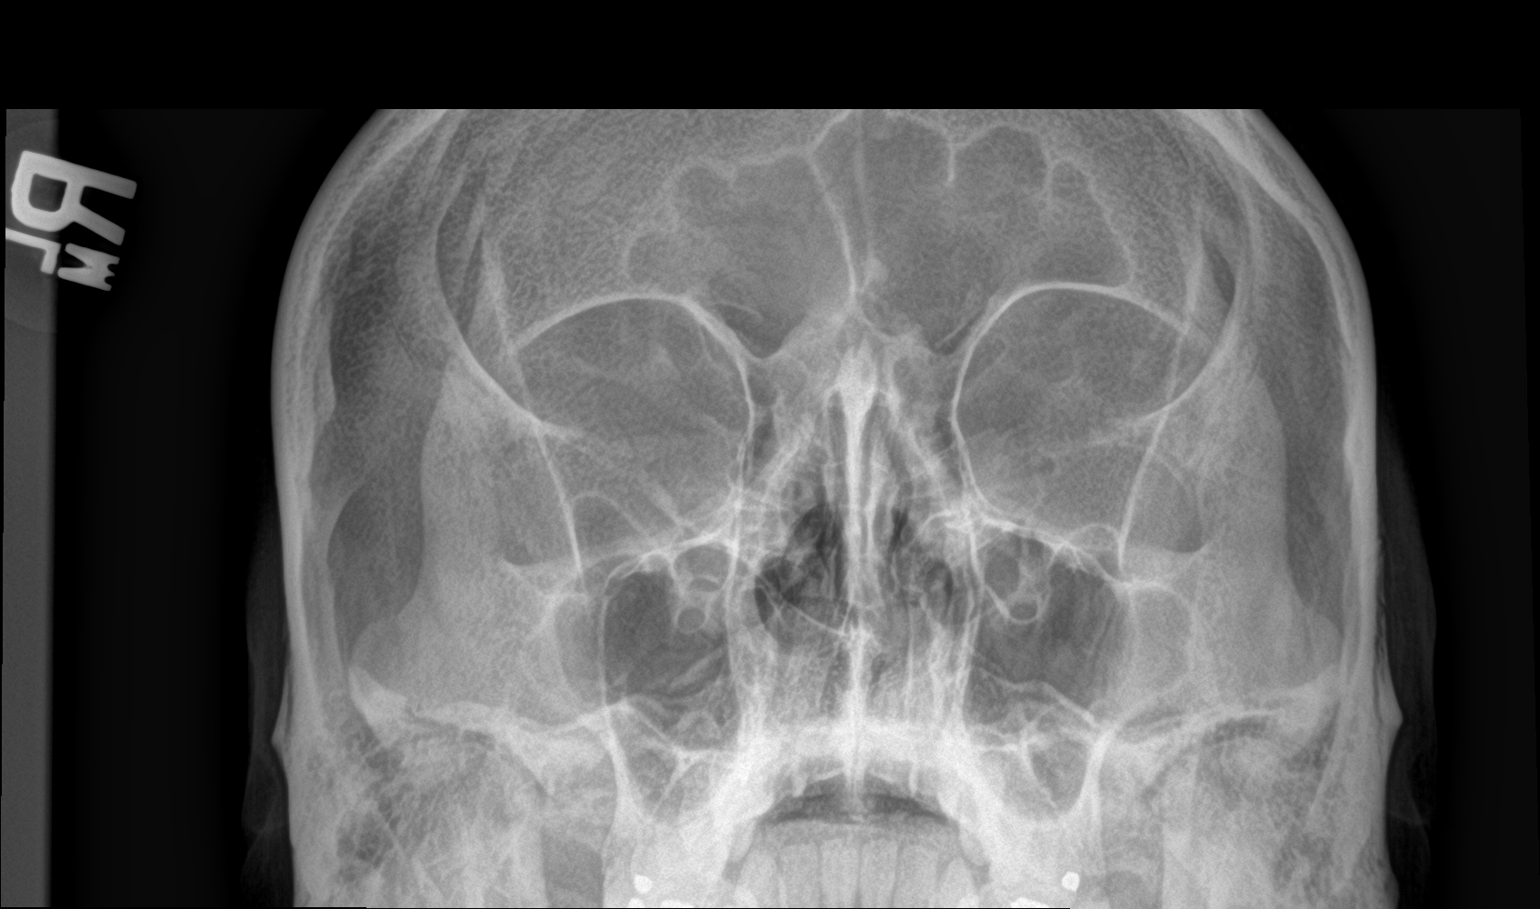

[2 of 2 positions shown; findings below may reference images not displayed]

FINDINGS: Water's views with eyes deviated toward the left and toward the
right were obtained. No intraorbital radiopaque foreign body.
Paranasal sinuses are clear. No fracture or dislocation.
IMPRESSION: No evidence of metallic foreign body within the orbits.

## 2020-12-06 ENCOUNTER — Other Ambulatory Visit: Payer: Self-pay | Admitting: General Surgery

## 2020-12-06 DIAGNOSIS — K469 Unspecified abdominal hernia without obstruction or gangrene: Secondary | ICD-10-CM

## 2020-12-26 ENCOUNTER — Other Ambulatory Visit: Payer: Self-pay

## 2020-12-26 ENCOUNTER — Ambulatory Visit
Admission: RE | Admit: 2020-12-26 | Discharge: 2020-12-26 | Disposition: A | Discharge status: Home / Self Care | Payer: BC Managed Care – PPO | Source: Ambulatory Visit | Attending: General Surgery | Admitting: General Surgery

## 2020-12-26 DIAGNOSIS — K469 Unspecified abdominal hernia without obstruction or gangrene: Secondary | ICD-10-CM | POA: Diagnosis not present

## 2020-12-26 MED ORDER — IOHEXOL 350 MG/ML SOLN
100.0000 mL | Freq: Once | INTRAVENOUS | Status: AC | PRN
  Administered 2020-12-26: 100 mL via INTRAVENOUS

## 2020-12-28 ENCOUNTER — Other Ambulatory Visit: Payer: Self-pay | Admitting: General Surgery

## 2021-01-01 ENCOUNTER — Other Ambulatory Visit: Payer: Self-pay | Admitting: General Surgery

## 2021-01-01 NOTE — Progress Notes (Signed)
Subjective:     Patient ID: Shawn Bates is a 45 y.o. male.   HPI   The following portions of the patient's history were reviewed and updated as appropriate.   This an established patient is here today for: office visit. Here to review CT scan results from 12-26-20.   Review of Systems  Constitutional: Negative for chills and fever.  Respiratory: Negative for cough.          Chief Complaint  Patient presents with   Follow-up      BP (!) 142/82   Pulse 110   Temp 36.1 C (97 F)   Ht 182.9 cm (6')   Wt (!) 157.9 kg (348 lb)   SpO2 98%   BMI 47.20 kg/m        Past Medical History:  Diagnosis Date   Acute prostatitis 01/2013   Asthma     Depression     Hypertension             Past Surgical History:  Procedure Laterality Date   REPAIR INCISIONAL/VENTRAL HERNIA   12/10/2005    Kugel patch   REPAIR UMBILICAL HERNIA   03/06/2013    Dr Lemar Livings, primary repair of 10 mm defect.          Social History          Socioeconomic History   Marital status: Married  Tobacco Use   Smoking status: Never Smoker   Smokeless tobacco: Never Used  Substance and Sexual Activity   Alcohol use: Never   Drug use: Never        No Known Allergies   Current Medications        Current Outpatient Medications  Medication Sig Dispense Refill   acetaminophen (TYLENOL) 325 MG tablet Take 650 mg by mouth every 4 (four) hours as needed for Pain       loratadine (CLARITIN) 10 mg tablet Take 10 mg by mouth once daily        No current facility-administered medications for this visit.             Family History  Problem Relation Age of Onset   Bone cancer Mother     Lung cancer Father     Prostate cancer Neg Hx     Bladder Cancer Neg Hx     Kidney cancer Neg Hx     Colon cancer Neg Hx     Breast cancer Neg Hx          Labs and Radiology:    CT of the abdomen and pelvis dated December 26, 2020:   This study was independently reviewed.  2 small fascial defects in the  midline just above the umbilicus and about 3 cm cephalad.  There is 5 cm of separation between the medial borders of the rectus muscles evident without particular prominence in the area of hernias.   BUN and creatinine obtained prior to his CT were both normal, 11 and 1.1 respectively.   Review of prior operative procedures reported a Kugel patch in the epigastric hernia at 2007 and a primary repair of a less than 1 cm fascial defect at the umbilicus in 2014.      Objective:   Physical Exam Constitutional:      Appearance: Normal appearance.  Skin:    General: Skin is warm and dry.  Neurological:     Mental Status: He is alert and oriented to person, place, and time.  Psychiatric:  Mood and Affect: Mood normal.        Behavior: Behavior normal.           Assessment:     New ventral hernias.    Plan:     Role of prosthetic mesh reviewed.   Role of proper lifting technique discussed.   Long-term risk of recurrent disease with his significant weight gain.   With the wide spacing of the rectus muscles, apparently his body habitus, a retrorectus mesh would be unlikely of benefit.   We will look to perform mesh reinforcement of the new defects with an onlay mesh to help minimize the risk of recurrence.   The patient is very active at work with minimal lifting 40 pounds.  He is aware that he will likely be 4 weeks plus or minus before he is able to return to work.      This note is partially prepared by Dorathy Daft, RN, acting as a scribe in the presence of Dr. Donnalee Curry, MD.  The documentation recorded by the scribe accurately reflects the service I personally performed and the decisions made by me.    Earline Mayotte, MD FACS

## 2021-02-24 ENCOUNTER — Other Ambulatory Visit: Payer: Self-pay

## 2021-02-24 ENCOUNTER — Other Ambulatory Visit
Admission: RE | Admit: 2021-02-24 | Discharge: 2021-02-24 | Disposition: A | Discharge status: Home / Self Care | Payer: BC Managed Care – PPO | Source: Ambulatory Visit | Attending: General Surgery | Admitting: General Surgery

## 2021-02-24 DIAGNOSIS — I10 Essential (primary) hypertension: Secondary | ICD-10-CM

## 2021-02-24 HISTORY — DX: Sleep apnea, unspecified: G47.30

## 2021-02-24 HISTORY — DX: Other complications of anesthesia, initial encounter: T88.59XA

## 2021-02-24 HISTORY — DX: Nausea with vomiting, unspecified: R11.2

## 2021-02-24 HISTORY — DX: Pneumonia, unspecified organism: J18.9

## 2021-02-24 HISTORY — DX: Gastro-esophageal reflux disease without esophagitis: K21.9

## 2021-02-24 NOTE — Patient Instructions (Addendum)
Your procedure is scheduled on: 03/06/21 - Monday Report to the Registration Desk on the 1st floor of the [facility]. To find out your arrival time, please call [phone] between 1PM - 3PM on: 03/03/21 - Friday Report to [facility] for Labs and EKG on 02/27/21 at 3:30 pm.  REMEMBER: Instructions that are not followed completely may result in serious medical risk, up to and including death; or upon the discretion of your surgeon and anesthesiologist your surgery may need to be rescheduled.  Do not eat food after midnight the night before surgery.  No gum chewing, lozengers or hard candies.  You may however, drink CLEAR liquids up to 2 hours before you are scheduled to arrive for your surgery. Do not drink anything within 2 hours of your scheduled arrival time.  Clear liquids include: - water  - apple juice without pulp - gatorade (not RED, PURPLE, OR BLUE) - black coffee or tea (Do NOT add milk or creamers to the coffee or tea) Do NOT drink anything that is not on this list.  TAKE THESE MEDICATIONS THE MORNING OF SURGERY WITH A SIP OF WATER:   Pepcid 10 mg tablet , take 1 tablet at bedtime the night before your procedure and take 1 tablet the morning of your procedure.  One week prior to surgery: Stop Anti-inflammatories (NSAIDS) such as Advil, Aleve, Ibuprofen, Motrin, Naproxen, Naprosyn and Aspirin based products such as Excedrin, Goodys Powder, BC Powder.  Stop ANY OVER THE COUNTER supplements until after surgery.  You may however, continue to take Tylenol if needed for pain up until the day of surgery.  No Alcohol for 24 hours before or after surgery.  No Smoking including e-cigarettes for 24 hours prior to surgery.  No chewable tobacco products for at least 6 hours prior to surgery.  No nicotine patches on the day of surgery.  Do not use any "recreational" drugs for at least a week prior to your surgery.  Please be advised that the combination of cocaine and  anesthesia may have negative outcomes, up to and including death. If you test positive for cocaine, your surgery will be cancelled.  On the morning of surgery brush your teeth with toothpaste and water, you may rinse your mouth with mouthwash if you wish. Do not swallow any toothpaste or mouthwash.  Use CHG Soap or wipes as directed on instruction sheet.  Do not wear jewelry, make-up, hairpins, clips or nail polish.  Do not wear lotions, powders, or perfumes.   Do not shave body from the neck down 48 hours prior to surgery just in case you cut yourself which could leave a site for infection.  Also, freshly shaved skin may become irritated if using the CHG soap.  Contact lenses, hearing aids and dentures may not be worn into surgery.  Do not bring valuables to the hospital. [facility] is not responsible for any missing/lost belongings or valuables.   Notify your doctor if there is any change in your medical condition (cold, fever, infection).  Wear comfortable clothing (specific to your surgery type) to the hospital.  After surgery, you can help prevent lung complications by doing breathing exercises.  Take deep breaths and cough every 1-2 hours. Your doctor may order a device called an Incentive Spirometer to help you take deep breaths. When coughing or sneezing, hold a pillow firmly against your incision with both hands. This is called "splinting." Doing this helps protect your incision. It also decreases belly discomfort.  If you are being admitted to the hospital overnight, leave your suitcase in the car. After surgery it may be brought to your room.  If you are being discharged the day of surgery, you will not be allowed to drive home. You will need a responsible adult (18 years or older) to drive you home and stay with you that night.   If you are taking public transportation, you will need to have a responsible adult (18 years or older) with you. Please confirm with your  physician that it is acceptable to use public transportation.   Please call the Pre-admissions Testing Dept. at [phone] if you have any questions about these instructions.  Surgery Visitation Policy:  Patients undergoing a surgery or procedure may have one family member or support person with them as long as that person is not COVID-19 positive or experiencing its symptoms.  That person may remain in the waiting area during the procedure and may rotate out with other people.  Inpatient Visitation:    Visiting hours are 7 a.m. to 8 p.m. Up to two visitors ages 16+ are allowed at one time in a patient room. The visitors may rotate out with other people during the day. Visitors must check out when they leave, or other visitors will not be allowed. One designated support person may remain overnight. The visitor must pass COVID-19 screenings, use hand sanitizer when entering and exiting the patient's room and wear a mask at all times, including in the patient's room. Patients must also wear a mask when staff or their visitor are in the room. Masking is required regardless of vaccination status.

## 2021-02-27 ENCOUNTER — Other Ambulatory Visit
Admission: RE | Admit: 2021-02-27 | Discharge: 2021-02-27 | Disposition: A | Discharge status: Home / Self Care | Payer: BC Managed Care – PPO | Source: Ambulatory Visit | Attending: General Surgery | Admitting: General Surgery

## 2021-02-27 ENCOUNTER — Other Ambulatory Visit: Payer: Self-pay

## 2021-02-27 DIAGNOSIS — Z01818 Encounter for other preprocedural examination: Secondary | ICD-10-CM | POA: Insufficient documentation

## 2021-02-27 DIAGNOSIS — I10 Essential (primary) hypertension: Secondary | ICD-10-CM

## 2021-02-27 LAB — BASIC METABOLIC PANEL
Anion gap: 8 (ref 5–15)
BUN: 14 mg/dL (ref 6–20)
CO2: 24 mmol/L (ref 22–32)
Calcium: 9 mg/dL (ref 8.9–10.3)
Chloride: 105 mmol/L (ref 98–111)
Creatinine, Ser: 0.94 mg/dL (ref 0.61–1.24)
GFR, Estimated: >60 mL/min (ref >60)
Glucose, Bld: 93 mg/dL (ref 70–99)
Potassium: 3.6 mmol/L (ref 3.5–5.1)
Sodium: 137 mmol/L (ref 135–145)

## 2021-02-27 LAB — CBC
HCT: 40.1 % (ref 39.0–52.0)
Hemoglobin: 13.2 g/dL (ref 13.0–17.0)
MCH: 29.5 pg (ref 26.0–34.0)
MCHC: 32.9 g/dL (ref 30.0–36.0)
MCV: 89.5 fL (ref 80.0–100.0)
Platelets: 324 10*3/uL (ref 150–400)
RBC: 4.48 MIL/uL (ref 4.22–5.81)
RDW: 12.8 % (ref 11.5–15.5)
WBC: 10.1 10*3/uL (ref 4.0–10.5)
nRBC: 0 % (ref 0.0–0.2)

## 2021-03-06 ENCOUNTER — Ambulatory Visit: Payer: BC Managed Care – PPO | Admitting: Certified Registered Nurse Anesthetist

## 2021-03-06 ENCOUNTER — Encounter
Admission: RE | Disposition: A | Discharge status: Home / Self Care | Payer: Self-pay | Source: Home / Self Care | Attending: General Surgery

## 2021-03-06 ENCOUNTER — Encounter: Payer: Self-pay | Admitting: General Surgery

## 2021-03-06 ENCOUNTER — Ambulatory Visit
Admission: RE | Admit: 2021-03-06 | Discharge: 2021-03-06 | Disposition: A | Discharge status: Home / Self Care | Payer: BC Managed Care – PPO | Attending: General Surgery | Admitting: General Surgery

## 2021-03-06 ENCOUNTER — Other Ambulatory Visit: Payer: Self-pay

## 2021-03-06 DIAGNOSIS — K429 Umbilical hernia without obstruction or gangrene: Secondary | ICD-10-CM | POA: Diagnosis present

## 2021-03-06 DIAGNOSIS — F32A Depression, unspecified: Secondary | ICD-10-CM | POA: Diagnosis not present

## 2021-03-06 DIAGNOSIS — G4730 Sleep apnea, unspecified: Secondary | ICD-10-CM | POA: Diagnosis not present

## 2021-03-06 DIAGNOSIS — K219 Gastro-esophageal reflux disease without esophagitis: Secondary | ICD-10-CM | POA: Insufficient documentation

## 2021-03-06 DIAGNOSIS — K432 Incisional hernia without obstruction or gangrene: Secondary | ICD-10-CM | POA: Insufficient documentation

## 2021-03-06 DIAGNOSIS — J45909 Unspecified asthma, uncomplicated: Secondary | ICD-10-CM | POA: Insufficient documentation

## 2021-03-06 DIAGNOSIS — I10 Essential (primary) hypertension: Secondary | ICD-10-CM | POA: Diagnosis not present

## 2021-03-06 DIAGNOSIS — Z6841 Body Mass Index (BMI) 40.0 and over, adult: Secondary | ICD-10-CM | POA: Insufficient documentation

## 2021-03-06 HISTORY — PX: INSERTION OF MESH: SHX5868

## 2021-03-06 HISTORY — PX: VENTRAL HERNIA REPAIR: SHX424

## 2021-03-06 SURGERY — REPAIR, HERNIA, VENTRAL
Anesthesia: General

## 2021-03-06 MED ORDER — ORAL CARE MOUTH RINSE: 15.0000 mL | Freq: Once | OROMUCOSAL | Status: AC

## 2021-03-06 MED ORDER — LIDOCAINE HCL (PF) 2 % IJ SOLN
INTRAMUSCULAR | Status: AC
  Filled 2021-03-06: qty 5

## 2021-03-06 MED ORDER — CHLORHEXIDINE GLUCONATE CLOTH 2 % EX PADS
6.0000 | MEDICATED_PAD | Freq: Once | CUTANEOUS | Status: AC
  Administered 2021-03-06: 07:00:00 6 via TOPICAL

## 2021-03-06 MED ORDER — CHLORHEXIDINE GLUCONATE 0.12 % MT SOLN: 15.0000 mL | Freq: Once | OROMUCOSAL | Status: AC

## 2021-03-06 MED ORDER — ROCURONIUM BROMIDE 100 MG/10ML IV SOLN
INTRAVENOUS | Status: DC | PRN
  Administered 2021-03-06: 30 mg via INTRAVENOUS
  Administered 2021-03-06: 10 mg via INTRAVENOUS
  Administered 2021-03-06: 20 mg via INTRAVENOUS

## 2021-03-06 MED ORDER — BUPIVACAINE-EPINEPHRINE (PF) 0.5% -1:200000 IJ SOLN
INTRAMUSCULAR | Status: DC | PRN
  Administered 2021-03-06: 30 mL

## 2021-03-06 MED ORDER — FENTANYL CITRATE (PF) 100 MCG/2ML IJ SOLN
INTRAMUSCULAR | Status: AC
  Filled 2021-03-06: qty 2

## 2021-03-06 MED ORDER — CEFAZOLIN IN SODIUM CHLORIDE 3-0.9 GM/100ML-% IV SOLN
3.0000 g | INTRAVENOUS | Status: AC
  Administered 2021-03-06: 08:00:00 3 g via INTRAVENOUS
  Filled 2021-03-06: qty 100

## 2021-03-06 MED ORDER — SODIUM CHLORIDE 0.9 % IV SOLN
12.5000 mg | Freq: Once | INTRAVENOUS | Status: AC
  Filled 2021-03-06: qty 0.5

## 2021-03-06 MED ORDER — BUPIVACAINE-EPINEPHRINE (PF) 0.5% -1:200000 IJ SOLN
INTRAMUSCULAR | Status: AC
  Filled 2021-03-06: qty 30

## 2021-03-06 MED ORDER — KETOROLAC TROMETHAMINE 30 MG/ML IJ SOLN
INTRAMUSCULAR | Status: DC | PRN
  Administered 2021-03-06: 30 mg via INTRAVENOUS

## 2021-03-06 MED ORDER — ONDANSETRON HCL 4 MG/2ML IJ SOLN
INTRAMUSCULAR | Status: AC
  Filled 2021-03-06: qty 2

## 2021-03-06 MED ORDER — HYDROMORPHONE HCL 1 MG/ML IJ SOLN
INTRAMUSCULAR | Status: AC
  Administered 2021-03-06: 10:00:00 0.5 mg via INTRAVENOUS
  Filled 2021-03-06: qty 1

## 2021-03-06 MED ORDER — OXYCODONE HCL 5 MG PO TABS: 5.0000 mg | ORAL_TABLET | Freq: Once | ORAL | Status: DC | PRN

## 2021-03-06 MED ORDER — FENTANYL CITRATE (PF) 100 MCG/2ML IJ SOLN
25.0000 ug | INTRAMUSCULAR | Status: DC | PRN
  Administered 2021-03-06: 10:00:00 25 ug via INTRAVENOUS
  Administered 2021-03-06: 09:00:00 50 ug via INTRAVENOUS
  Administered 2021-03-06: 09:00:00 25 ug via INTRAVENOUS

## 2021-03-06 MED ORDER — SUCCINYLCHOLINE CHLORIDE 200 MG/10ML IV SOSY
PREFILLED_SYRINGE | INTRAVENOUS | Status: DC | PRN
  Administered 2021-03-06: 120 mg via INTRAVENOUS

## 2021-03-06 MED ORDER — ACETAMINOPHEN 10 MG/ML IV SOLN
INTRAVENOUS | Status: DC | PRN
  Administered 2021-03-06: 1000 mg via INTRAVENOUS

## 2021-03-06 MED ORDER — OXYMETAZOLINE HCL 0.05 % NA SOLN
NASAL | Status: AC
  Filled 2021-03-06: qty 30

## 2021-03-06 MED ORDER — PHENYLEPHRINE HCL (PRESSORS) 10 MG/ML IV SOLN
INTRAVENOUS | Status: DC | PRN
  Administered 2021-03-06 (×3): 160 ug via INTRAVENOUS
  Administered 2021-03-06: 80 ug via INTRAVENOUS
  Administered 2021-03-06: 160 ug via INTRAVENOUS

## 2021-03-06 MED ORDER — SODIUM CHLORIDE 0.9 % IV SOLN: 12.5000 mg | Freq: Once | INTRAVENOUS | Status: DC

## 2021-03-06 MED ORDER — DEXAMETHASONE SODIUM PHOSPHATE 10 MG/ML IJ SOLN
INTRAMUSCULAR | Status: DC | PRN
  Administered 2021-03-06: 10 mg via INTRAVENOUS

## 2021-03-06 MED ORDER — HYDROCODONE-ACETAMINOPHEN 5-325 MG PO TABS: 1.0000 | ORAL_TABLET | ORAL | 0 refills | Status: AC | PRN

## 2021-03-06 MED ORDER — OXYCODONE HCL 5 MG/5ML PO SOLN: 5.0000 mg | Freq: Once | ORAL | Status: DC | PRN

## 2021-03-06 MED ORDER — PHENYLEPHRINE HCL-NACL 20-0.9 MG/250ML-% IV SOLN
INTRAVENOUS | Status: AC
  Filled 2021-03-06: qty 250

## 2021-03-06 MED ORDER — FENTANYL CITRATE (PF) 100 MCG/2ML IJ SOLN
INTRAMUSCULAR | Status: AC
  Administered 2021-03-06: 10:00:00 25 ug via INTRAVENOUS
  Filled 2021-03-06: qty 2

## 2021-03-06 MED ORDER — PROMETHAZINE HCL 25 MG PO TABS: 25.0000 mg | ORAL_TABLET | ORAL | 0 refills | Status: DC | PRN

## 2021-03-06 MED ORDER — MIDAZOLAM HCL 2 MG/2ML IJ SOLN
INTRAMUSCULAR | Status: DC | PRN
  Administered 2021-03-06: 2 mg via INTRAVENOUS

## 2021-03-06 MED ORDER — DEXAMETHASONE SODIUM PHOSPHATE 10 MG/ML IJ SOLN
INTRAMUSCULAR | Status: AC
  Filled 2021-03-06: qty 1

## 2021-03-06 MED ORDER — LACTATED RINGERS IV SOLN: INTRAVENOUS | Status: DC

## 2021-03-06 MED ORDER — ACETAMINOPHEN 10 MG/ML IV SOLN: 1000.0000 mg | Freq: Once | INTRAVENOUS | Status: DC | PRN

## 2021-03-06 MED ORDER — SCOPOLAMINE 1 MG/3DAYS TD PT72
1.0000 | MEDICATED_PATCH | TRANSDERMAL | Status: DC
  Administered 2021-03-06: 07:00:00 1.5 mg via TRANSDERMAL

## 2021-03-06 MED ORDER — SCOPOLAMINE 1 MG/3DAYS TD PT72
MEDICATED_PATCH | TRANSDERMAL | Status: AC
  Filled 2021-03-06: qty 1

## 2021-03-06 MED ORDER — PROPOFOL 500 MG/50ML IV EMUL
INTRAVENOUS | Status: AC
  Filled 2021-03-06: qty 50

## 2021-03-06 MED ORDER — LIDOCAINE HCL (CARDIAC) PF 100 MG/5ML IV SOSY
PREFILLED_SYRINGE | INTRAVENOUS | Status: DC | PRN
  Administered 2021-03-06: 100 mg via INTRAVENOUS

## 2021-03-06 MED ORDER — CHLORHEXIDINE GLUCONATE 0.12 % MT SOLN
OROMUCOSAL | Status: AC
  Administered 2021-03-06: 07:00:00 15 mL via OROMUCOSAL
  Filled 2021-03-06: qty 15

## 2021-03-06 MED ORDER — KETOROLAC TROMETHAMINE 30 MG/ML IJ SOLN
INTRAMUSCULAR | Status: AC
  Filled 2021-03-06: qty 1

## 2021-03-06 MED ORDER — DEXMEDETOMIDINE HCL IN NACL 200 MCG/50ML IV SOLN
INTRAVENOUS | Status: AC
  Filled 2021-03-06: qty 50

## 2021-03-06 MED ORDER — PROMETHAZINE HCL 25 MG/ML IJ SOLN
INTRAMUSCULAR | Status: AC
  Administered 2021-03-06: 11:00:00 12.5 mg
  Filled 2021-03-06: qty 1

## 2021-03-06 MED ORDER — ONDANSETRON HCL 4 MG/2ML IJ SOLN
4.0000 mg | Freq: Once | INTRAMUSCULAR | Status: AC | PRN
  Administered 2021-03-06: 09:00:00 4 mg via INTRAVENOUS

## 2021-03-06 MED ORDER — FENTANYL CITRATE (PF) 100 MCG/2ML IJ SOLN
INTRAMUSCULAR | Status: AC
  Administered 2021-03-06: 09:00:00 25 ug via INTRAVENOUS
  Filled 2021-03-06: qty 2

## 2021-03-06 MED ORDER — SUCCINYLCHOLINE CHLORIDE 200 MG/10ML IV SOSY
PREFILLED_SYRINGE | INTRAVENOUS | Status: AC
  Filled 2021-03-06: qty 10

## 2021-03-06 MED ORDER — MIDAZOLAM HCL 2 MG/2ML IJ SOLN
INTRAMUSCULAR | Status: AC
  Filled 2021-03-06: qty 2

## 2021-03-06 MED ORDER — SUGAMMADEX SODIUM 500 MG/5ML IV SOLN
INTRAVENOUS | Status: AC
  Filled 2021-03-06: qty 5

## 2021-03-06 MED ORDER — ONDANSETRON HCL 4 MG/2ML IJ SOLN
INTRAMUSCULAR | Status: DC | PRN
  Administered 2021-03-06: 4 mg via INTRAVENOUS

## 2021-03-06 MED ORDER — SUGAMMADEX SODIUM 200 MG/2ML IV SOLN
INTRAVENOUS | Status: DC | PRN
  Administered 2021-03-06: 308.4 mg via INTRAVENOUS

## 2021-03-06 MED ORDER — PROPOFOL 10 MG/ML IV BOLUS
INTRAVENOUS | Status: DC | PRN
  Administered 2021-03-06: 250 mg via INTRAVENOUS

## 2021-03-06 MED ORDER — ROCURONIUM BROMIDE 10 MG/ML (PF) SYRINGE
PREFILLED_SYRINGE | INTRAVENOUS | Status: AC
  Filled 2021-03-06: qty 10

## 2021-03-06 MED ORDER — SEVOFLURANE IN SOLN
RESPIRATORY_TRACT | Status: AC
  Filled 2021-03-06: qty 250

## 2021-03-06 MED ORDER — HYDROMORPHONE HCL 1 MG/ML IJ SOLN
0.5000 mg | INTRAMUSCULAR | Status: DC | PRN
  Administered 2021-03-06: 10:00:00 0.5 mg via INTRAVENOUS

## 2021-03-06 MED ORDER — FENTANYL CITRATE (PF) 100 MCG/2ML IJ SOLN
INTRAMUSCULAR | Status: DC | PRN
  Administered 2021-03-06 (×4): 50 ug via INTRAVENOUS

## 2021-03-06 SURGICAL SUPPLY — 46 items
APL PRP STRL LF DISP 70% ISPRP (MISCELLANEOUS) ×2
APL SKNCLS STERI-STRIP NONHPOA (GAUZE/BANDAGES/DRESSINGS) ×2
BENZOIN TINCTURE PRP APPL 2/3 (GAUZE/BANDAGES/DRESSINGS) ×4 IMPLANT
BLADE BOVIE TIP EXT 4 (BLADE) ×2 IMPLANT
BLADE SURG 15 STRL SS SAFETY (BLADE) ×4 IMPLANT
BULB RESERV EVAC DRAIN JP 100C (MISCELLANEOUS) IMPLANT
CHLORAPREP W/TINT 26 (MISCELLANEOUS) ×4 IMPLANT
CLOSURE WOUND 1/2 X4 (GAUZE/BANDAGES/DRESSINGS) ×1
DRAIN CHANNEL JP 15F RND 16 (MISCELLANEOUS) IMPLANT
DRAPE LAPAROTOMY TRNSV 106X77 (MISCELLANEOUS) ×4 IMPLANT
DRSG TEGADERM 4X4.75 (GAUZE/BANDAGES/DRESSINGS) ×8 IMPLANT
DRSG TELFA 3X8 NADH (GAUZE/BANDAGES/DRESSINGS) ×4 IMPLANT
ELECT REM PT RETURN 9FT ADLT (ELECTROSURGICAL) ×4
ELECTRODE REM PT RTRN 9FT ADLT (ELECTROSURGICAL) ×2 IMPLANT
GAUZE 4X4 16PLY ~~LOC~~+RFID DBL (SPONGE) ×4 IMPLANT
GAUZE SPONGE 4X4 12PLY STRL (GAUZE/BANDAGES/DRESSINGS) IMPLANT
GLOVE SURG ENC MOIS LTX SZ7.5 (GLOVE) ×8 IMPLANT
GLOVE SURG UNDER LTX SZ8 (GLOVE) ×8 IMPLANT
GOWN STRL REUS W/ TWL LRG LVL3 (GOWN DISPOSABLE) ×4 IMPLANT
GOWN STRL REUS W/TWL LRG LVL3 (GOWN DISPOSABLE) ×12
KIT TURNOVER KIT A (KITS) ×4 IMPLANT
LABEL OR SOLS (LABEL) ×4 IMPLANT
MANIFOLD NEPTUNE II (INSTRUMENTS) ×4 IMPLANT
MESH VENTRALEX ST 8CM LRG (Mesh General) ×2 IMPLANT
NDL HYPO 25X1 1.5 SAFETY (NEEDLE) ×2 IMPLANT
NEEDLE HYPO 22GX1.5 SAFETY (NEEDLE) ×4 IMPLANT
NEEDLE HYPO 25X1 1.5 SAFETY (NEEDLE) ×4 IMPLANT
NS IRRIG 500ML POUR BTL (IV SOLUTION) ×4 IMPLANT
PACK BASIN MINOR ARMC (MISCELLANEOUS) ×4 IMPLANT
PAD DRESSING TELFA 3X8 NADH (GAUZE/BANDAGES/DRESSINGS) ×2 IMPLANT
SPONGE T-LAP 18X18 ~~LOC~~+RFID (SPONGE) ×4 IMPLANT
STAPLER SKIN PROX 35W (STAPLE) ×2 IMPLANT
STRIP CLOSURE SKIN 1/2X4 (GAUZE/BANDAGES/DRESSINGS) ×3 IMPLANT
SUT MAXON ABS #0 GS21 30IN (SUTURE) ×4 IMPLANT
SUT SURGILON 0 BLK (SUTURE) ×4 IMPLANT
SUT VIC AB 0 CT1 36 (SUTURE) ×4 IMPLANT
SUT VIC AB 2-0 BRD 54 (SUTURE) ×4 IMPLANT
SUT VIC AB 2-0 CT1 (SUTURE) ×4 IMPLANT
SUT VIC AB 3-0 SH 27 (SUTURE) ×4
SUT VIC AB 3-0 SH 27X BRD (SUTURE) ×2 IMPLANT
SUT VIC AB 4-0 FS2 27 (SUTURE) ×4 IMPLANT
SUT VICRYL+ 3-0 144IN (SUTURE) ×4 IMPLANT
SYR 10ML LL (SYRINGE) ×4 IMPLANT
SYR 3ML LL SCALE MARK (SYRINGE) ×4 IMPLANT
TRAY FOLEY MTR SLVR 16FR STAT (SET/KITS/TRAYS/PACK) IMPLANT
WATER STERILE IRR 500ML POUR (IV SOLUTION) ×2 IMPLANT

## 2021-03-06 NOTE — Anesthesia Procedure Notes (Signed)
Procedure Name: Intubation Date/Time: 03/06/2021 7:38 AM Performed by: Malva Cogan, CRNA Pre-anesthesia Checklist: Patient identified, Patient being monitored, Timeout performed, Emergency Drugs available and Suction available Patient Re-evaluated:Patient Re-evaluated prior to induction Oxygen Delivery Method: Circle system utilized Preoxygenation: Pre-oxygenation with 100% oxygen Induction Type: IV induction Ventilation: Oral airway inserted - appropriate to patient size and Two handed mask ventilation required Laryngoscope Size: McGraph and 4 Grade View: Grade II Tube type: Oral Tube size: 7.5 mm Number of attempts: 1 Airway Equipment and Method: Stylet Placement Confirmation: ETT inserted through vocal cords under direct vision, positive ETCO2 and breath sounds checked- equal and bilateral Secured at: 24 cm Tube secured with: Tape Dental Injury: Teeth and Oropharynx as per pre-operative assessment

## 2021-03-06 NOTE — Transfer of Care (Signed)
Immediate Anesthesia Transfer of Care Note  Patient: Shawn Bates  Procedure(s) Performed: HERNIA REPAIR VENTRAL ADULT INSERTION OF MESH  Patient Location: PACU  Anesthesia Type:General  Level of Consciousness: awake, alert  and oriented  Airway & Oxygen Therapy: Patient Spontanous Breathing and Patient connected to nasal cannula oxygen  Post-op Assessment: Report given to RN and Post -op Vital signs reviewed and stable  Post vital signs: Reviewed and stable  Last Vitals:  Vitals Value Taken Time  BP 128/92 03/06/21 0857  Temp    Pulse 95 03/06/21 0858  Resp 18 03/06/21 0858  SpO2 100 % 03/06/21 0858  Vitals shown include unvalidated device data.  Last Pain:  Vitals:   03/06/21 0454  TempSrc: Temporal  PainSc: 0-No pain         Complications: No notable events documented.

## 2021-03-06 NOTE — Op Note (Signed)
Preoperative diagnosis: Recurrent ventral hernia.  Postoperative diagnosis: Same.  Operative procedure: Ventral hernia repair with 8 cm Ventralex mesh.  Operating surgeon Donnalee Curry, MD.  Assistant: Haynes Hoehn, RNFA.  Anesthesia: General endotracheal, Marcaine 0.5% with 1: 200,000 units of epinephrine, 30 cc.  Estimated blood loss: Less than 5 cc.  Clinical note: This 45 year old male had previously had primary repair of a tiny umbilical hernia and Kugel patch repair of a epigastric hernia.  He is developed defect in between these 2 sites.  CT showed 2 defects maximum diameter 5 cm.  He is admitted at this time for elective repair.  He received Ancef 3 g intravenously prior to the procedure.  SCD stockings for DVT prevention.  Operative note: With the patient under general anesthesia the abdomen was cleansed with ChloraPrep and draped.  Field block anesthesia was established with Marcaine.  A midline incision was made from just below the prior epigastric hernia transverse incision down to just below the umbilical fold.  The skin was incised sharply and remaining dissection completed with electrocautery.  The dominant hernia sac was about 3 cm above the umbilical fold, corresponding with the CT scan.  This was about 3 cm maximal diameter once it was freed circumferentially.  The small bridge of tissue between the 2 sites was divided and a 1 cm defect at the umbilicus was found.  The undersurface of the fascia was cleared circumferentially up to the edge of the previously placed Kugel patch without evidence of additional defects.  The peritoneum was not entered.  An 8 cm Ventralex patch was sutured transvaginally circumferentially around the defect.  The defect was then closed transversely with interrupted 0 Surgilon sutures.  The adipose layer was closed in 2 layers with 2-0 Vicryl running suture.  The skin was closed with staples.  A honeycomb dressing was applied.  The patient tolerated  the procedure well with moderate coughing during extubation but no clinical suspicion of ruptured sutures during this time.  The patient was taken to the PACU in stable condition.

## 2021-03-06 NOTE — Progress Notes (Signed)
Notified Dr. Lemar Livings that patient is in post op phase 2, and that patient would like to receive a prescription for nausea before going home.

## 2021-03-06 NOTE — Anesthesia Postprocedure Evaluation (Signed)
Anesthesia Post Note  Patient: Jamarri Vuncannon  Procedure(s) Performed: HERNIA REPAIR VENTRAL ADULT INSERTION OF MESH  Patient location during evaluation: PACU Anesthesia Type: General Level of consciousness: awake and alert, oriented and patient cooperative Pain management: pain level controlled Vital Signs Assessment: post-procedure vital signs reviewed and stable Respiratory status: spontaneous breathing, nonlabored ventilation and respiratory function stable Cardiovascular status: blood pressure returned to baseline and stable Postop Assessment: adequate PO intake Anesthetic complications: no   No notable events documented.   Last Vitals:  Vitals:   03/06/21 0940 03/06/21 0945  BP:  (!) 128/93  Pulse: 88 78  Resp: (!) 7 10  Temp:    SpO2: 93% 97%    Last Pain:  Vitals:   03/06/21 0945  TempSrc:   PainSc: 7                  Reed Breech

## 2021-03-06 NOTE — Progress Notes (Signed)
Patient is doing great, nausea is resolved with phenergan administration.

## 2021-03-06 NOTE — Anesthesia Preprocedure Evaluation (Addendum)
Anesthesia Evaluation  Patient identified by MRN, date of birth, ID band Patient awake    Reviewed: Allergy & Precautions, NPO status , Patient's Chart, lab work & pertinent test results  History of Anesthesia Complications (+) PONV and history of anesthetic complications  Airway Mallampati: III   Neck ROM: Full    Dental  (+)    Pulmonary asthma , sleep apnea ,    Pulmonary exam normal breath sounds clear to auscultation       Cardiovascular hypertension, Normal cardiovascular exam Rhythm:Regular Rate:Normal  ECG 02/27/21:  Sinus tachycardia Minimal voltage criteria for LVH, may be normal variant ( R in aVL ) Borderline ECG When compared with ECG of 13-Jun-2017 09:56, No significant change since last tracing   Neuro/Psych PSYCHIATRIC DISORDERS Depression negative neurological ROS     GI/Hepatic GERD  ,  Endo/Other  Class 3 obesity  Renal/GU negative Renal ROS     Musculoskeletal   Abdominal   Peds  Hematology negative hematology ROS (+)   Anesthesia Other Findings   Reproductive/Obstetrics                            Anesthesia Physical Anesthesia Plan  ASA: 3  Anesthesia Plan: General   Post-op Pain Management:    Induction: Intravenous  PONV Risk Score and Plan: 3 and Ondansetron, Dexamethasone, Treatment may vary due to age or medical condition and Scopolamine patch - Pre-op  Airway Management Planned: Oral ETT  Additional Equipment:   Intra-op Plan:   Post-operative Plan: Extubation in OR  Informed Consent: I have reviewed the patients History and Physical, chart, labs and discussed the procedure including the risks, benefits and alternatives for the proposed anesthesia with the patient or authorized representative who has indicated his/her understanding and acceptance.     Dental advisory given  Plan Discussed with: CRNA  Anesthesia Plan Comments: (Patient  consented for risks of anesthesia including but not limited to:  - adverse reactions to medications - damage to eyes, teeth, lips or other oral mucosa - nerve damage due to positioning  - sore throat or hoarseness - damage to heart, brain, nerves, lungs, other parts of body or loss of life  Informed patient about role of CRNA in peri- and intra-operative care.  Patient voiced understanding.)        Anesthesia Quick Evaluation

## 2021-03-06 NOTE — H&P (Signed)
Shawn Bates [id number] 1975/10/15     HPI: Recurrent ventral hernia for elective repair.   Medications Prior to Admission  Medication Sig Dispense Refill Last Dose   acetaminophen (TYLENOL) 500 MG tablet Take 1,000 mg by mouth every 6 (six) hours as needed for mild pain or headache.   03/05/2021   famotidine (PEPCID) 10 MG tablet Take 10 mg by mouth 2 (two) times daily as needed for heartburn or indigestion.   03/06/2021   No Known Allergies Past Medical History:  Diagnosis Date   Asthma    Complication of anesthesia    Depression    GERD (gastroesophageal reflux disease)    Hernia 03/20/2007   Hypertension    Pneumonia    PONV (postoperative nausea and vomiting)    Prostatitis 01/17/2013   Treated with oral Cipro with relief of symptoms   Sleep apnea    Umbilical hernia 02/18/2013   Past Surgical History:  Procedure Laterality Date   HERNIA REPAIR  December 10, 2005   ventral hernia with Kugel patch.   HERNIA REPAIR  03/06/13   umbilical hernia   Social History   Socioeconomic History   Marital status: Married    Spouse name: Fleet Contras   Number of children: Not on file   Years of education: Not on file   Highest education level: Not on file  Occupational History   Not on file  Tobacco Use   Smoking status: Never   Smokeless tobacco: Never  Substance and Sexual Activity   Alcohol use: No   Drug use: No   Sexual activity: Not on file  Other Topics Concern   Not on file  Social History Narrative   Not on file   Social Determinants of Health   Financial Resource Strain: Not on file  Food Insecurity: Not on file  Transportation Needs: Not on file  Physical Activity: Not on file  Stress: Not on file  Social Connections: Not on file  Intimate Partner Violence: Not on file   Social History   Social History Narrative   Not on file     ROS: Negative.     PE: HEENT: Negative. Lungs: Clear. Cardio: RR.    Assessment/Plan:  Proceed with planned  ventral hernia repair.     Shawn Bates Shawn Bates 03/06/2021

## 2021-03-06 NOTE — Discharge Instructions (Signed)
AMBULATORY SURGERY  ?DISCHARGE INSTRUCTIONS ? ? ?The drugs that you were given will stay in your system until tomorrow so for the next 24 hours you should not: ? ?Drive an automobile ?Make any legal decisions ?Drink any alcoholic beverage ? ? ?You may resume regular meals tomorrow.  Today it is better to start with liquids and gradually work up to solid foods. ? ?You may eat anything you prefer, but it is better to start with liquids, then soup and crackers, and gradually work up to solid foods. ? ? ?Please notify your doctor immediately if you have any unusual bleeding, trouble breathing, redness and pain at the surgery site, drainage, fever, or pain not relieved by medication. ? ? ? ?Additional Instructions: ? ? ? ?Please contact your physician with any problems or Same Day Surgery at [phone], Monday through Friday 6 am to 4 pm, or [place] [facility] [place] Main number at [phone].  ?

## 2022-11-28 DIAGNOSIS — M25561 Pain in right knee: Secondary | ICD-10-CM | POA: Diagnosis not present

## 2022-11-28 DIAGNOSIS — M25562 Pain in left knee: Secondary | ICD-10-CM | POA: Diagnosis not present

## 2023-04-08 DIAGNOSIS — M25521 Pain in right elbow: Secondary | ICD-10-CM | POA: Diagnosis not present

## 2023-04-12 DIAGNOSIS — M25521 Pain in right elbow: Secondary | ICD-10-CM | POA: Diagnosis not present

## 2023-04-16 DIAGNOSIS — S46211A Strain of muscle, fascia and tendon of other parts of biceps, right arm, initial encounter: Secondary | ICD-10-CM | POA: Diagnosis not present

## 2023-05-03 DIAGNOSIS — Z01818 Encounter for other preprocedural examination: Secondary | ICD-10-CM | POA: Diagnosis not present

## 2023-05-03 DIAGNOSIS — S46291D Other injury of muscle, fascia and tendon of other parts of biceps, right arm, subsequent encounter: Secondary | ICD-10-CM | POA: Diagnosis not present

## 2023-05-08 DIAGNOSIS — W19XXXA Unspecified fall, initial encounter: Secondary | ICD-10-CM | POA: Diagnosis not present

## 2023-05-08 DIAGNOSIS — Y9361 Activity, american tackle football: Secondary | ICD-10-CM | POA: Diagnosis not present

## 2023-05-08 DIAGNOSIS — Y92096 Garden or yard of other non-institutional residence as the place of occurrence of the external cause: Secondary | ICD-10-CM | POA: Diagnosis not present

## 2023-05-08 DIAGNOSIS — G8918 Other acute postprocedural pain: Secondary | ICD-10-CM | POA: Diagnosis not present

## 2023-05-08 DIAGNOSIS — S46291A Other injury of muscle, fascia and tendon of other parts of biceps, right arm, initial encounter: Secondary | ICD-10-CM | POA: Diagnosis not present

## 2023-05-08 DIAGNOSIS — S46211A Strain of muscle, fascia and tendon of other parts of biceps, right arm, initial encounter: Secondary | ICD-10-CM | POA: Diagnosis not present

## 2023-05-24 DIAGNOSIS — S46291D Other injury of muscle, fascia and tendon of other parts of biceps, right arm, subsequent encounter: Secondary | ICD-10-CM | POA: Diagnosis not present

## 2023-05-27 DIAGNOSIS — S46291D Other injury of muscle, fascia and tendon of other parts of biceps, right arm, subsequent encounter: Secondary | ICD-10-CM | POA: Diagnosis not present

## 2023-06-05 DIAGNOSIS — S46291D Other injury of muscle, fascia and tendon of other parts of biceps, right arm, subsequent encounter: Secondary | ICD-10-CM | POA: Diagnosis not present

## 2023-06-21 DIAGNOSIS — S46291D Other injury of muscle, fascia and tendon of other parts of biceps, right arm, subsequent encounter: Secondary | ICD-10-CM | POA: Diagnosis not present

## 2023-09-10 DIAGNOSIS — G5623 Lesion of ulnar nerve, bilateral upper limbs: Secondary | ICD-10-CM | POA: Diagnosis not present

## 2024-02-25 ENCOUNTER — Encounter: Payer: Self-pay | Admitting: General Surgery

## 2024-02-26 ENCOUNTER — Ambulatory Visit: Admitting: Nurse Practitioner

## 2024-02-26 ENCOUNTER — Encounter: Payer: Self-pay | Admitting: Nurse Practitioner

## 2024-02-26 VITALS — BP 156/104 | HR 90 | Temp 98.7°F | Ht 72.0 in | Wt 345.0 lb

## 2024-02-26 DIAGNOSIS — Z130 Encounter for screening for diseases of the blood and blood-forming organs and certain disorders involving the immune mechanism: Secondary | ICD-10-CM

## 2024-02-26 DIAGNOSIS — I10 Essential (primary) hypertension: Secondary | ICD-10-CM | POA: Diagnosis not present

## 2024-02-26 DIAGNOSIS — Z1159 Encounter for screening for other viral diseases: Secondary | ICD-10-CM

## 2024-02-26 DIAGNOSIS — Z1211 Encounter for screening for malignant neoplasm of colon: Secondary | ICD-10-CM

## 2024-02-26 DIAGNOSIS — Z13220 Encounter for screening for lipoid disorders: Secondary | ICD-10-CM | POA: Diagnosis not present

## 2024-02-26 DIAGNOSIS — R079 Chest pain, unspecified: Secondary | ICD-10-CM

## 2024-02-26 DIAGNOSIS — Z114 Encounter for screening for human immunodeficiency virus [HIV]: Secondary | ICD-10-CM | POA: Diagnosis not present

## 2024-02-26 DIAGNOSIS — Z131 Encounter for screening for diabetes mellitus: Secondary | ICD-10-CM | POA: Diagnosis not present

## 2024-02-26 MED ORDER — AMLODIPINE BESYLATE 5 MG PO TABS: 5.0000 mg | ORAL_TABLET | Freq: Every day | ORAL | 0 refills | Status: DC

## 2024-02-26 NOTE — Progress Notes (Signed)
 BP (!) 156/104   Pulse 90   Temp 98.7 F (37.1 C)   Ht 6' (1.829 m)   Wt (!) 345 lb (156.5 kg)   SpO2 98%   BMI 46.79 kg/m    Subjective:    Patient ID: Shawn Bates, male    DOB: 1975/04/13, 48 y.o.   MRN: [id number]  HPI: Shawn Bates is a 48 y.o. male  Chief Complaint  Patient presents with   Establish Care    Concerns about family history of hypertension. States he can feel his chest tighten with activity   Discussed the use of AI scribe software for clinical note transcription with the patient, who gave verbal consent to proceed.  History of Present Illness Shawn Bates is a 48 year old male who presents with elevated blood pressure and chest tightness.  Hypertension - Elevated blood pressure noted today at 159/118 mmHg. - No prior antihypertensive medication use. - Diet high in sodium. - Family history of hypertension. - No frequent headaches.  Chest tightness and pressure - Sensation of pressure or deep ache in the chest, particularly during intense moments such as watching his son play sports. - Chest tightness occurs during physical activity and occasional twinges during intimate moments. - Symptoms have been present for approximately one year. - No chest pain at rest. - No palpitations except during intense activities like watching sports. - No shortness of breath. EKG: normal EKG, normal sinus rhythm.  Sleep disturbance - Occasional snoring. - denies waking up tired  Physical activity and lifestyle - Minimal exercise outside of work due to work schedule. 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 - Drinks a large cup of black coffee daily.  Musculoskeletal injury - History of distal bicep tendon repair in February following injury during backyard football. - Recovery was challenging due to right-handedness.         02/26/2024    8:08 AM  Depression screen PHQ 2/9  Decreased Interest 0  Down, Depressed, Hopeless 0   PHQ - 2 Score 0    Relevant past medical, surgical, family and social history reviewed and updated as indicated. Interim medical history since our last visit reviewed. Allergies and medications reviewed and updated.  Review of Systems  Ten systems reviewed and is negative except as mentioned in HPI      Objective:      BP (!) 156/104   Pulse 90   Temp 98.7 F (37.1 C)   Ht 6' (1.829 m)   Wt (!) 345 lb (156.5 kg)   SpO2 98%   BMI 46.79 kg/m   BP Readings from Last 3 Encounters:  02/26/24 (!) 156/104  03/06/21 (!) 145/96  06/13/17 (!) 150/97     Wt Readings from Last 3 Encounters:  02/26/24 (!) 345 lb (156.5 kg)  03/06/21 (!) 340 lb (154.2 kg)  06/13/17 (!) 310 lb (140.6 kg)    Physical Exam VITALS: BP- 159/118 GENERAL: Alert, cooperative, well developed, no acute distress HEENT: Normocephalic, normal oropharynx, moist mucous membranes CHEST: Clear to auscultation bilaterally, No wheezes, rhonchi, or crackles CARDIOVASCULAR: Normal heart rate and rhythm, S1 and S2 normal without murmurs, Heart sounds normal ABDOMEN: Soft, non-tender, non-distended, without organomegaly, Normal bowel sounds EXTREMITIES: No cyanosis or edema NEUROLOGICAL: Cranial nerves grossly intact, Moves all extremities without gross motor or sensory deficit  Results for orders placed or performed during the hospital encounter of 02/27/21  CBC   Collection Time: 02/27/21  3:44 PM  Result Value Ref Range   WBC 10.1 4.0 - 10.5 K/uL   RBC 4.48 4.22 - 5.81 MIL/uL   Hemoglobin 13.2 13.0 - 17.0 g/dL   HCT 59.8 60.9 - 47.9 %   MCV 89.5 80.0 - 100.0 fL   MCH 29.5 26.0 - 34.0 pg   MCHC 32.9 30.0 - 36.0 g/dL   RDW 87.1 88.4 - 84.4 %   Platelets 324 150 - 400 K/uL   nRBC 0.0 0.0 - 0.2 %  Basic metabolic panel   Collection Time: 02/27/21  3:44 PM  Result Value Ref Range   Sodium 137 135 - 145 mmol/L   Potassium 3.6 3.5 - 5.1 mmol/L   Chloride 105 98 - 111 mmol/L   CO2 24 22 - 32 mmol/L   Glucose,  Bld 93 70 - 99 mg/dL   BUN 14 6 - 20 mg/dL   Creatinine, Ser 9.05 0.61 - 1.24 mg/dL   Calcium 9.0 8.9 - 89.6 mg/dL   GFR, Estimated >39 >39 mL/min   Anion gap 8 5 - 15          Assessment & Plan:   Problem List Items Addressed This Visit       Cardiovascular and Mediastinum   Primary hypertension - Primary   Relevant Medications   amLODipine (NORVASC) 5 MG tablet   Other Visit Diagnoses       Screening for colon cancer       Relevant Orders   Ambulatory referral to Gastroenterology     Chest pain, unspecified type       Relevant Orders   Ambulatory referral to Cardiology   EKG 12-Lead     Screening for cholesterol level       Relevant Orders   Lipid panel     Screening for diabetes mellitus       Relevant Orders   Comprehensive metabolic panel with GFR   Hemoglobin A1c     Screening for deficiency anemia       Relevant Orders   CBC with Differential/Platelet     Encounter for hepatitis C screening test for low risk patient       Relevant Orders   Hepatitis C antibody     Screening for HIV without presence of risk factors       Relevant Orders   HIV Antibody (routine testing w rflx)        Assessment and Plan Assessment & Plan Primary hypertension Hypertension with a family history, currently uncontrolled with a blood pressure of 159/118 mmHg. No prior antihypertensive medication use. Discussed pathophysiology and risks of uncontrolled hypertension, including heart failure. Amlodipine chosen as first-line treatment due to efficacy in African American patients. Discussed potential side effect of leg swelling, more common at higher doses. Emphasized lifestyle modifications, including reducing sodium intake and increasing physical activity. - Started amlodipine 5 mg daily. - Scheduled nurse visit for blood pressure recheck next week. - Encouraged reduction of sodium intake and increase in physical activity, such as 10-minute walks three times a week. - Ordered  blood work to assess overall health status.  Chest pain Intermittent chest tightness with activity, particularly during intense activities like watching sports. No family history of heart disease. EKG shows normal sinus rhythm with no ST elevation or depression. Discussed potential risk factors for heart attack, including hypertension, obesity, and possible hyperlipidemia. Awaiting blood work results for further assessment. Discussed potential for heart attack during activity and importance of further evaluation. - Ordered blood work to  assess cholesterol levels and other risk factors. - Referred to cardiologist for further evaluation. - Encouraged lifestyle modifications to reduce cardiovascular risk.  Obesity Contributing factor to hypertension and potential cardiovascular risk. Discussed importance of weight management in controlling blood pressure and reducing cardiovascular risk. - Encouraged lifestyle modifications, including dietary changes and increased physical activity. - Encourage continuation of lifestyle modifications, including dietary management and regular exercise. -continue to increase physical activity, getting at least 150 min of physical activity a week.  Work on including runner, broadcasting/film/video 2 days a week.  - continue eating at a calorie deficit 2000-2200 cal a day, eating a well balanced diet with whole foods, avoiding processed foods.   Patient is motivated to continue working on lifestyle modification.    General health maintenance Discussed importance of regular screenings and preventive care. Colonoscopy recommended for age-appropriate screening. Discussed potential for Cologuard as an alternative, but colonoscopy preferred for comprehensive evaluation. - Referred to gastroenterologist for colonoscopy. - Encouraged regular health screenings and preventive care.        Follow up plan: Return in about 1 week (around 03/04/2024) for nurse visit, 1 month with me.

## 2024-02-27 LAB — COMPREHENSIVE METABOLIC PANEL WITH GFR
AG Ratio: 1.2 (calc) (ref 1.0–2.5)
ALT: 19 U/L (ref 9–46)
AST: 19 U/L (ref 10–40)
Albumin: 4.2 g/dL (ref 3.6–5.1)
Alkaline phosphatase (APISO): 41 U/L (ref 36–130)
BUN: 11 mg/dL (ref 7–25)
CO2: 28 mmol/L (ref 20–32)
Calcium: 9.3 mg/dL (ref 8.6–10.3)
Chloride: 103 mmol/L (ref 98–110)
Creat: 0.91 mg/dL (ref 0.60–1.29)
Globulin: 3.4 g/dL (ref 1.9–3.7)
Glucose, Bld: 93 mg/dL (ref 65–99)
Potassium: 4.3 mmol/L (ref 3.5–5.3)
Sodium: 139 mmol/L (ref 135–146)
Total Bilirubin: 0.7 mg/dL (ref 0.2–1.2)
Total Protein: 7.6 g/dL (ref 6.1–8.1)
eGFR: 105 mL/min/1.73m2 (ref >=60)

## 2024-02-27 LAB — LIPID PANEL
Cholesterol: 228 mg/dL — ABNORMAL HIGH (ref <200)
HDL: 43 mg/dL (ref >=40)
LDL Cholesterol (Calc): 160 mg/dL — ABNORMAL HIGH
Non-HDL Cholesterol (Calc): 185 mg/dL — ABNORMAL HIGH (ref <130)
Total CHOL/HDL Ratio: 5.3 (calc) — ABNORMAL HIGH (ref <5.0)
Triglycerides: 126 mg/dL (ref <150)

## 2024-02-27 LAB — CBC WITH DIFFERENTIAL/PLATELET
Absolute Lymphocytes: 1953 {cells}/uL (ref 850–3900)
Absolute Monocytes: 661 {cells}/uL (ref 200–950)
Basophils Absolute: 53 {cells}/uL (ref 0–200)
Basophils Relative: 0.7 %
Eosinophils Absolute: 281 {cells}/uL (ref 15–500)
Eosinophils Relative: 3.7 %
HCT: 43.2 % (ref 39.4–51.1)
Hemoglobin: 14.1 g/dL (ref 13.2–17.1)
MCH: 29.1 pg (ref 27.0–33.0)
MCHC: 32.6 g/dL (ref 31.6–35.4)
MCV: 89.3 fL (ref 81.4–101.7)
MPV: 10.9 fL (ref 7.5–12.5)
Monocytes Relative: 8.7 %
Neutro Abs: 4651 {cells}/uL (ref 1500–7800)
Neutrophils Relative %: 61.2 %
Platelets: 332 Thousand/uL (ref 140–400)
RBC: 4.84 Million/uL (ref 4.20–5.80)
RDW: 12.8 % (ref 11.0–15.0)
Total Lymphocyte: 25.7 %
WBC: 7.6 Thousand/uL (ref 3.8–10.8)

## 2024-02-27 LAB — HEMOGLOBIN A1C
Hgb A1c MFr Bld: 6.1 % — ABNORMAL HIGH (ref <5.7)
Mean Plasma Glucose: 128 mg/dL
eAG (mmol/L): 7.1 mmol/L

## 2024-02-27 LAB — HIV ANTIBODY (ROUTINE TESTING W REFLEX)
HIV 1&2 Ab, 4th Generation: NONREACTIVE
HIV FINAL INTERPRETATION: NEGATIVE

## 2024-02-27 LAB — HEPATITIS C ANTIBODY: Hepatitis C Ab: NONREACTIVE

## 2024-02-28 ENCOUNTER — Ambulatory Visit: Payer: Self-pay | Admitting: Nurse Practitioner

## 2024-03-04 ENCOUNTER — Ambulatory Visit

## 2024-03-04 DIAGNOSIS — I10 Essential (primary) hypertension: Secondary | ICD-10-CM

## 2024-03-04 NOTE — Progress Notes (Signed)
 Patient is in office today for a nurse visit for Blood Pressure Check. Patient blood pressure was 132 /90, Patient No chest pain, No shortness of breath, No dyspnea on exertion, No orthopnea, No paroxysmal nocturnal dyspnea, No edema, No palpitations, No syncope   Per last visit: Primary hypertension Hypertension with a family history, currently uncontrolled with a blood pressure of 159/118 mmHg. No prior antihypertensive medication use. Discussed pathophysiology and risks of uncontrolled hypertension, including heart failure. Amlodipine  chosen as first-line treatment due to efficacy in African American patients. Discussed potential side effect of leg swelling, more common at higher doses. Emphasized lifestyle modifications, including reducing sodium intake and increasing physical activity. - Started amlodipine  5 mg daily. - Scheduled nurse visit for blood pressure recheck next week. - Encouraged reduction of sodium intake and increase in physical activity, such as 10-minute walks three times a week. - Ordered blood work to assess overall health status.  Please advise

## 2024-03-05 NOTE — Progress Notes (Unsigned)
°  Cardiology Office Note   Date:  03/05/2024  ID:  Shawn Bates, DOB 04-04-1975, MRN [id number] PCP: Gareth Mliss FALCON, FNP  [facility] Providers Cardiologist:  None { Click to update primary MD,subspecialty MD or APP then REFRESH:1}    History of Present Illness Shawn Bates is a 48 y.o. male PMH HTN who presents for further evaluation management of chest discomfort.  Patient seen by PCP for this issue 02/26/2024.  Reportedly had a year of on and off chest discomfort.  Blood pressure uncontrolled, so Norvasc  started.  Last LDL 160 02/26/2024.  Relevant CVD History -None   ROS: Pt denies any chest discomfort, jaw pain, arm pain, palpitations, syncope, presyncope, orthopnea, PND, or LE edema.  Studies Reviewed I have independently reviewed the patient's ECG, previous medical records, previous blood work.  Physical Exam VS:  There were no vitals taken for this visit.       Wt Readings from Last 3 Encounters:  02/26/24 (!) 345 lb (156.5 kg)  03/06/21 (!) 340 lb (154.2 kg)  06/13/17 (!) 310 lb (140.6 kg)    GEN: No acute distress. NECK: No JVD; No carotid bruits. CARDIAC: ***RRR, no murmurs, rubs, gallops. RESPIRATORY:  Clear to auscultation. EXTREMITIES:  Warm and well-perfused. No edema.  ASSESSMENT AND PLAN Chest discomfort HLD        {Are you ordering a CV Procedure (e.g. stress test, cath, DCCV, TEE, etc)?   Press F2        :[id number]}  Dispo: ***  Signed, Caron Poser, MD

## 2024-03-09 ENCOUNTER — Ambulatory Visit

## 2024-03-09 VITALS — BP 141/99 | HR 97 | Ht 72.0 in | Wt 338.8 lb

## 2024-03-09 DIAGNOSIS — R079 Chest pain, unspecified: Secondary | ICD-10-CM | POA: Diagnosis not present

## 2024-03-09 DIAGNOSIS — R9431 Abnormal electrocardiogram [ECG] [EKG]: Secondary | ICD-10-CM

## 2024-03-09 DIAGNOSIS — E782 Mixed hyperlipidemia: Secondary | ICD-10-CM | POA: Diagnosis not present

## 2024-03-09 MED ORDER — ROSUVASTATIN CALCIUM 5 MG PO TABS: 5.0000 mg | ORAL_TABLET | Freq: Every day | ORAL | 3 refills | Status: AC

## 2024-03-09 MED ORDER — AMLODIPINE BESYLATE 10 MG PO TABS: 10.0000 mg | ORAL_TABLET | Freq: Every day | ORAL | 3 refills | Status: AC

## 2024-03-09 NOTE — Patient Instructions (Signed)
 Medication Instructions:  Your physician recommends the following medication changes.  START TAKING: Rosuvastatin  (CRESTOR ) 5 mg once daily   INCREASE: Amlodipine  (NORVASC ) to 10 mg once daily  Continue all other medications as prescribed.   *If you need a refill on your cardiac medications before your next appointment, please call your pharmacy*  Lab Work:  No labs ordered today   If you have labs (blood work) drawn today and your tests are completely normal, you will receive your results only by: MyChart Message (if you have MyChart) OR A paper copy in the mail If you have any lab test that is abnormal or we need to change your treatment, we will call you to review the results.  Testing/Procedures: Your physician has requested that you have an echocardiogram. Echocardiography is a painless test that uses sound waves to create images of your heart. It provides your doctor with information about the size and shape of your heart and how well your hearts chambers and valves are working.   You may receive an ultrasound enhancing agent through an IV if needed to better visualize your heart during the echo. This procedure takes approximately one hour.  There are no restrictions for this procedure.  This will take place at [address] [facility] [address], [place] [address]  Please note: We ask at that you not bring children with you during ultrasound (echo/ vascular) testing. Due to room size and safety concerns, children are not allowed in the ultrasound rooms during exams. Our front office staff cannot provide observation of children in our lobby area while testing is being conducted. An adult accompanying a patient to their appointment will only be allowed in the ultrasound room at the discretion of the ultrasound technician under special circumstances. We apologize for any inconvenience.      Please report to Radiology at the [facility] Main Entrance 30  minutes early for your test.  [address] [place], [place] [address]                         OR   Please report to Radiology at [facility] Main Entrance, medical mall, 30 mins prior to your test.  [address]  [place], [place]  How to Prepare for Your Cardiac PET/CT Stress Test:  Nothing to eat or drink, except water, 3 hours prior to arrival time.  NO caffeine/decaffeinated products, or chocolate 12 hours prior to arrival. (Please note decaffeinated beverages (teas/coffees) still contain caffeine).  If you have caffeine within 12 hours prior, the test will need to be rescheduled.  Medication instructions:    You may take your medications with sips of water.  Do not take erectile dysfunction medications for 72 hours prior to test (sildenafil, tadalafil)   NO perfume, cologne or lotion on chest or abdomen area.   Total time is 1 to 2 hours; you may want to bring reading material for the waiting time.   In preparation for your appointment, medication and supplies will be purchased.  Appointment availability is limited, so if you need to cancel or reschedule, please call the Radiology Department Scheduler at [phone] 24 hours in advance to avoid a cancellation fee of $100.00  What to Expect When you Arrive:  Once you arrive and check in for your appointment, you will be taken to a preparation room within the Radiology Department.  A technologist or Nurse will obtain your medical history,  verify that you are correctly prepped for the exam, and explain the procedure.  Afterwards, an IV will be started in your arm and electrodes will be placed on your skin for EKG monitoring during the stress portion of the exam. Then you will be escorted to the PET/CT scanner.  There, staff will get you positioned on the scanner and obtain a blood pressure and EKG.  During the exam, you will continue to be connected to the EKG and blood pressure machines.  A small,  safe amount of a radioactive tracer will be injected in your IV to obtain a series of pictures of your heart along with an injection of a stress agent.    After your Exam:  It is recommended that you eat a meal and drink a caffeinated beverage to counter act any effects of the stress agent.  Drink plenty of fluids for the remainder of the day and urinate frequently for the first couple of hours after the exam.  Your doctor will inform you of your test results within 7-10 business days.  For more information and frequently asked questions, please visit our website: [email or web address]  For questions about your test or how to prepare for your test, please call: Cardiac Imaging Nurse Navigators Office: [phone]   Follow-Up: At [facility], you and your health needs are our priority.  As part of our continuing mission to provide you with exceptional heart care, our providers are all part of one team.  This team includes your primary Cardiologist (physician) and Advanced Practice Providers or APPs (Physician Assistants and Nurse Practitioners) who all work together to provide you with the care you need, when you need it.  Your next appointment:  3 month(s)  Provider:  Caron Poser, MD    We recommend signing up for the patient portal called MyChart.  Sign up information is provided on this After Visit Summary.  MyChart is used to connect with patients for Virtual Visits (Telemedicine).  Patients are able to view lab/test results, encounter notes, upcoming appointments, etc.  Non-urgent messages can be sent to your provider as well.   To learn more about what you can do with MyChart, go to [email or web address].

## 2024-03-09 NOTE — Addendum Note (Signed)
 Addended by: HARL HERON DEL on: 03/09/2024 10:37 AM   Modules accepted: Orders

## 2024-03-10 ENCOUNTER — Ambulatory Visit

## 2024-03-10 DIAGNOSIS — R079 Chest pain, unspecified: Secondary | ICD-10-CM

## 2024-03-10 DIAGNOSIS — R9431 Abnormal electrocardiogram [ECG] [EKG]: Secondary | ICD-10-CM

## 2024-03-10 LAB — ECHOCARDIOGRAM COMPLETE
AR max vel: 2.88 cm2
AV Area VTI: 3.15 cm2
AV Area mean vel: 2.74 cm2
AV Mean grad: 3.5 mmHg
AV Peak grad: 6.6 mmHg
Ao pk vel: 1.28 m/s
Area-P 1/2: 6.37 cm2
Calc EF: 49.5 %
S' Lateral: 4.3 cm
Single Plane A2C EF: 45.5 %
Single Plane A4C EF: 54.5 %

## 2024-03-11 ENCOUNTER — Ambulatory Visit: Payer: Self-pay

## 2024-03-30 ENCOUNTER — Ambulatory Visit: Admitting: Nurse Practitioner

## 2024-04-09 ENCOUNTER — Ambulatory Visit

## 2024-06-09 ENCOUNTER — Ambulatory Visit
